# Patient Record
Sex: Female | Born: 1960 | Race: White | Hispanic: No | Marital: Married | Smoking: Current every day smoker
Health system: Southern US, Community
[De-identification: ages and names within clinical notes are randomized; demographics above are authoritative.]

## PROBLEM LIST (undated history)

## (undated) DIAGNOSIS — C801 Malignant (primary) neoplasm, unspecified: Secondary | ICD-10-CM

## (undated) DIAGNOSIS — H409 Unspecified glaucoma: Secondary | ICD-10-CM

## (undated) DIAGNOSIS — J4489 Other specified chronic obstructive pulmonary disease: Secondary | ICD-10-CM

## (undated) DIAGNOSIS — Z9981 Dependence on supplemental oxygen: Secondary | ICD-10-CM

## (undated) DIAGNOSIS — E113593 Type 2 diabetes mellitus with proliferative diabetic retinopathy without macular edema, bilateral: Secondary | ICD-10-CM

## (undated) DIAGNOSIS — J449 Chronic obstructive pulmonary disease, unspecified: Secondary | ICD-10-CM

## (undated) DIAGNOSIS — E785 Hyperlipidemia, unspecified: Secondary | ICD-10-CM

## (undated) DIAGNOSIS — E119 Type 2 diabetes mellitus without complications: Secondary | ICD-10-CM

## (undated) DIAGNOSIS — I255 Ischemic cardiomyopathy: Secondary | ICD-10-CM

## (undated) DIAGNOSIS — I5020 Unspecified systolic (congestive) heart failure: Secondary | ICD-10-CM

## (undated) DIAGNOSIS — F329 Major depressive disorder, single episode, unspecified: Secondary | ICD-10-CM

## (undated) DIAGNOSIS — R112 Nausea with vomiting, unspecified: Secondary | ICD-10-CM

## (undated) DIAGNOSIS — Z972 Presence of dental prosthetic device (complete) (partial): Secondary | ICD-10-CM

## (undated) DIAGNOSIS — Z87442 Personal history of urinary calculi: Secondary | ICD-10-CM

## (undated) DIAGNOSIS — Z95828 Presence of other vascular implants and grafts: Secondary | ICD-10-CM

## (undated) DIAGNOSIS — I4891 Unspecified atrial fibrillation: Secondary | ICD-10-CM

## (undated) DIAGNOSIS — Z889 Allergy status to unspecified drugs, medicaments and biological substances status: Secondary | ICD-10-CM

## (undated) DIAGNOSIS — F32A Depression, unspecified: Secondary | ICD-10-CM

## (undated) DIAGNOSIS — I10 Essential (primary) hypertension: Secondary | ICD-10-CM

## (undated) DIAGNOSIS — Z9889 Other specified postprocedural states: Secondary | ICD-10-CM

## (undated) DIAGNOSIS — I447 Left bundle-branch block, unspecified: Secondary | ICD-10-CM

## (undated) HISTORY — DX: Hyperlipidemia, unspecified: E78.5

## (undated) HISTORY — PX: TUBAL LIGATION: SHX77

## (undated) HISTORY — DX: Type 2 diabetes mellitus without complications: E11.9

## (undated) HISTORY — PX: EYE SURGERY: SHX253

---
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
## 2006-03-08 ENCOUNTER — Emergency Department: Payer: Self-pay | Admitting: Unknown Physician Specialty

## 2007-01-02 ENCOUNTER — Emergency Department: Payer: Self-pay | Admitting: Emergency Medicine

## 2008-05-17 ENCOUNTER — Emergency Department (HOSPITAL_COMMUNITY): Admission: EM | Admit: 2008-05-17 | Discharge: 2008-05-18 | Payer: Self-pay | Admitting: Emergency Medicine

## 2011-04-27 LAB — URINE MICROSCOPIC-ADD ON

## 2011-04-27 LAB — URINALYSIS, ROUTINE W REFLEX MICROSCOPIC
Glucose, UA: >1000 — AB
Protein, ur: 100 — AB
Specific Gravity, Urine: 1.025
pH: 6

## 2014-05-27 ENCOUNTER — Ambulatory Visit: Payer: Self-pay | Admitting: Ophthalmology

## 2014-07-12 DIAGNOSIS — J309 Allergic rhinitis, unspecified: Secondary | ICD-10-CM | POA: Insufficient documentation

## 2014-07-12 DIAGNOSIS — F329 Major depressive disorder, single episode, unspecified: Secondary | ICD-10-CM | POA: Insufficient documentation

## 2014-07-12 DIAGNOSIS — I10 Essential (primary) hypertension: Secondary | ICD-10-CM | POA: Insufficient documentation

## 2014-07-12 DIAGNOSIS — F419 Anxiety disorder, unspecified: Secondary | ICD-10-CM | POA: Insufficient documentation

## 2014-08-14 DIAGNOSIS — E559 Vitamin D deficiency, unspecified: Secondary | ICD-10-CM | POA: Insufficient documentation

## 2014-08-14 DIAGNOSIS — E119 Type 2 diabetes mellitus without complications: Secondary | ICD-10-CM | POA: Insufficient documentation

## 2014-10-07 ENCOUNTER — Ambulatory Visit
Admit: 2014-10-07 | Disposition: A | Discharge status: Home / Self Care | Payer: Self-pay | Attending: Family Medicine | Admitting: Family Medicine

## 2014-10-25 ENCOUNTER — Ambulatory Visit
Admit: 2014-10-25 | Disposition: A | Discharge status: Home / Self Care | Payer: Self-pay | Attending: Family Medicine | Admitting: Family Medicine

## 2015-02-25 ENCOUNTER — Other Ambulatory Visit: Payer: Self-pay | Admitting: Family Medicine

## 2015-02-25 DIAGNOSIS — Z1231 Encounter for screening mammogram for malignant neoplasm of breast: Secondary | ICD-10-CM

## 2015-03-14 ENCOUNTER — Ambulatory Visit
Admission: RE | Admit: 2015-03-14 | Discharge: 2015-03-14 | Disposition: A | Discharge status: Home / Self Care | Payer: BLUE CROSS/BLUE SHIELD | Source: Ambulatory Visit | Attending: Family Medicine | Admitting: Family Medicine

## 2015-03-14 DIAGNOSIS — Z1231 Encounter for screening mammogram for malignant neoplasm of breast: Secondary | ICD-10-CM | POA: Diagnosis not present

## 2016-04-14 ENCOUNTER — Encounter (INDEPENDENT_AMBULATORY_CARE_PROVIDER_SITE_OTHER): Payer: Self-pay

## 2016-05-17 ENCOUNTER — Encounter (INDEPENDENT_AMBULATORY_CARE_PROVIDER_SITE_OTHER): Payer: BLUE CROSS/BLUE SHIELD

## 2016-05-17 ENCOUNTER — Ambulatory Visit (INDEPENDENT_AMBULATORY_CARE_PROVIDER_SITE_OTHER): Payer: Self-pay | Admitting: Vascular Surgery

## 2016-05-31 ENCOUNTER — Other Ambulatory Visit: Payer: Self-pay | Admitting: Family Medicine

## 2016-05-31 DIAGNOSIS — Z1231 Encounter for screening mammogram for malignant neoplasm of breast: Secondary | ICD-10-CM

## 2016-06-09 ENCOUNTER — Ambulatory Visit
Admission: RE | Admit: 2016-06-09 | Discharge: 2016-06-09 | Disposition: A | Discharge status: Home / Self Care | Payer: PRIVATE HEALTH INSURANCE | Source: Ambulatory Visit | Attending: Family Medicine | Admitting: Family Medicine

## 2016-06-09 DIAGNOSIS — Z1231 Encounter for screening mammogram for malignant neoplasm of breast: Secondary | ICD-10-CM | POA: Diagnosis present

## 2017-02-09 DIAGNOSIS — I517 Cardiomegaly: Secondary | ICD-10-CM | POA: Insufficient documentation

## 2017-05-13 DIAGNOSIS — J449 Chronic obstructive pulmonary disease, unspecified: Secondary | ICD-10-CM | POA: Insufficient documentation

## 2017-05-13 DIAGNOSIS — J4489 Other specified chronic obstructive pulmonary disease: Secondary | ICD-10-CM | POA: Insufficient documentation

## 2017-07-28 DIAGNOSIS — I255 Ischemic cardiomyopathy: Secondary | ICD-10-CM | POA: Insufficient documentation

## 2017-07-28 DIAGNOSIS — I447 Left bundle-branch block, unspecified: Secondary | ICD-10-CM | POA: Insufficient documentation

## 2017-09-28 ENCOUNTER — Other Ambulatory Visit: Payer: Self-pay | Admitting: Family Medicine

## 2017-09-28 DIAGNOSIS — Z1231 Encounter for screening mammogram for malignant neoplasm of breast: Secondary | ICD-10-CM

## 2017-09-30 ENCOUNTER — Other Ambulatory Visit: Payer: Self-pay | Admitting: Physician Assistant

## 2017-09-30 DIAGNOSIS — S62015P Nondisplaced fracture of distal pole of navicular [scaphoid] bone of left wrist, subsequent encounter for fracture with malunion: Secondary | ICD-10-CM

## 2017-10-05 ENCOUNTER — Ambulatory Visit
Admission: RE | Admit: 2017-10-05 | Discharge: 2017-10-05 | Disposition: A | Discharge status: Home / Self Care | Payer: BLUE CROSS/BLUE SHIELD | Source: Ambulatory Visit | Attending: Family Medicine | Admitting: Family Medicine

## 2017-10-05 DIAGNOSIS — Z1231 Encounter for screening mammogram for malignant neoplasm of breast: Secondary | ICD-10-CM | POA: Diagnosis present

## 2017-10-11 ENCOUNTER — Ambulatory Visit
Admission: RE | Admit: 2017-10-11 | Discharge: 2017-10-11 | Disposition: A | Discharge status: Home / Self Care | Payer: BLUE CROSS/BLUE SHIELD | Source: Ambulatory Visit | Attending: Physician Assistant | Admitting: Physician Assistant

## 2017-10-11 DIAGNOSIS — S62015P Nondisplaced fracture of distal pole of navicular [scaphoid] bone of left wrist, subsequent encounter for fracture with malunion: Secondary | ICD-10-CM

## 2017-10-11 DIAGNOSIS — S62015A Nondisplaced fracture of distal pole of navicular [scaphoid] bone of left wrist, initial encounter for closed fracture: Secondary | ICD-10-CM | POA: Insufficient documentation

## 2017-10-11 DIAGNOSIS — X58XXXA Exposure to other specified factors, initial encounter: Secondary | ICD-10-CM | POA: Insufficient documentation

## 2017-11-30 DIAGNOSIS — I272 Pulmonary hypertension, unspecified: Secondary | ICD-10-CM | POA: Insufficient documentation

## 2017-12-09 ENCOUNTER — Encounter: Payer: Self-pay | Admitting: Emergency Medicine

## 2017-12-12 ENCOUNTER — Ambulatory Visit
Admission: RE | Admit: 2017-12-12 | Payer: PRIVATE HEALTH INSURANCE | Source: Ambulatory Visit | Admitting: Gastroenterology

## 2017-12-12 ENCOUNTER — Encounter: Admission: RE | Payer: Self-pay | Source: Ambulatory Visit

## 2017-12-12 HISTORY — DX: Allergy status to unspecified drugs, medicaments and biological substances: Z88.9

## 2017-12-12 HISTORY — DX: Left bundle-branch block, unspecified: I44.7

## 2017-12-12 HISTORY — DX: Other specified chronic obstructive pulmonary disease: J44.89

## 2017-12-12 HISTORY — DX: Essential (primary) hypertension: I10

## 2017-12-12 HISTORY — DX: Depression, unspecified: F32.A

## 2017-12-12 HISTORY — DX: Major depressive disorder, single episode, unspecified: F32.9

## 2017-12-12 HISTORY — DX: Chronic obstructive pulmonary disease, unspecified: J44.9

## 2017-12-12 HISTORY — DX: Type 2 diabetes mellitus with proliferative diabetic retinopathy without macular edema, bilateral: E11.3593

## 2017-12-12 HISTORY — DX: Unspecified glaucoma: H40.9

## 2017-12-12 HISTORY — DX: Ischemic cardiomyopathy: I25.5

## 2017-12-12 SURGERY — COLONOSCOPY WITH PROPOFOL
Anesthesia: General

## 2018-01-12 DIAGNOSIS — R911 Solitary pulmonary nodule: Secondary | ICD-10-CM | POA: Insufficient documentation

## 2018-05-08 DIAGNOSIS — R700 Elevated erythrocyte sedimentation rate: Secondary | ICD-10-CM | POA: Insufficient documentation

## 2018-05-22 DIAGNOSIS — R768 Other specified abnormal immunological findings in serum: Secondary | ICD-10-CM | POA: Insufficient documentation

## 2018-10-16 DIAGNOSIS — R160 Hepatomegaly, not elsewhere classified: Secondary | ICD-10-CM | POA: Insufficient documentation

## 2018-10-16 DIAGNOSIS — I2510 Atherosclerotic heart disease of native coronary artery without angina pectoris: Secondary | ICD-10-CM | POA: Insufficient documentation

## 2018-10-25 DIAGNOSIS — C801 Malignant (primary) neoplasm, unspecified: Secondary | ICD-10-CM

## 2018-10-25 HISTORY — DX: Malignant (primary) neoplasm, unspecified: C80.1

## 2018-10-27 DIAGNOSIS — I739 Peripheral vascular disease, unspecified: Secondary | ICD-10-CM | POA: Insufficient documentation

## 2018-11-02 DIAGNOSIS — Z951 Presence of aortocoronary bypass graft: Secondary | ICD-10-CM | POA: Insufficient documentation

## 2018-11-02 DIAGNOSIS — G8918 Other acute postprocedural pain: Secondary | ICD-10-CM | POA: Insufficient documentation

## 2018-11-05 DIAGNOSIS — I4891 Unspecified atrial fibrillation: Secondary | ICD-10-CM | POA: Insufficient documentation

## 2018-11-05 DIAGNOSIS — I9789 Other postprocedural complications and disorders of the circulatory system, not elsewhere classified: Secondary | ICD-10-CM | POA: Insufficient documentation

## 2018-11-06 DIAGNOSIS — K567 Ileus, unspecified: Secondary | ICD-10-CM | POA: Insufficient documentation

## 2019-01-03 ENCOUNTER — Encounter: Payer: Self-pay | Admitting: *Deleted

## 2019-01-31 DIAGNOSIS — C221 Intrahepatic bile duct carcinoma: Secondary | ICD-10-CM | POA: Insufficient documentation

## 2019-02-21 DIAGNOSIS — E042 Nontoxic multinodular goiter: Secondary | ICD-10-CM | POA: Insufficient documentation

## 2019-02-21 DIAGNOSIS — Z79899 Other long term (current) drug therapy: Secondary | ICD-10-CM | POA: Insufficient documentation

## 2019-02-21 DIAGNOSIS — E0590 Thyrotoxicosis, unspecified without thyrotoxic crisis or storm: Secondary | ICD-10-CM | POA: Insufficient documentation

## 2019-04-12 ENCOUNTER — Emergency Department: Payer: BC Managed Care – PPO

## 2019-04-12 ENCOUNTER — Encounter: Payer: Self-pay | Admitting: Emergency Medicine

## 2019-04-12 ENCOUNTER — Emergency Department
Admission: EM | Admit: 2019-04-12 | Discharge: 2019-04-12 | Disposition: A | Discharge status: Home / Self Care | Payer: BC Managed Care – PPO | Attending: Emergency Medicine | Admitting: Emergency Medicine

## 2019-04-12 ENCOUNTER — Other Ambulatory Visit: Payer: Self-pay

## 2019-04-12 DIAGNOSIS — Z7982 Long term (current) use of aspirin: Secondary | ICD-10-CM | POA: Insufficient documentation

## 2019-04-12 DIAGNOSIS — Z7984 Long term (current) use of oral hypoglycemic drugs: Secondary | ICD-10-CM | POA: Insufficient documentation

## 2019-04-12 DIAGNOSIS — Z79899 Other long term (current) drug therapy: Secondary | ICD-10-CM | POA: Insufficient documentation

## 2019-04-12 DIAGNOSIS — F17210 Nicotine dependence, cigarettes, uncomplicated: Secondary | ICD-10-CM | POA: Diagnosis not present

## 2019-04-12 DIAGNOSIS — I10 Essential (primary) hypertension: Secondary | ICD-10-CM | POA: Diagnosis not present

## 2019-04-12 DIAGNOSIS — R059 Cough, unspecified: Secondary | ICD-10-CM

## 2019-04-12 DIAGNOSIS — R05 Cough: Secondary | ICD-10-CM | POA: Diagnosis present

## 2019-04-12 DIAGNOSIS — F17200 Nicotine dependence, unspecified, uncomplicated: Secondary | ICD-10-CM

## 2019-04-12 DIAGNOSIS — E119 Type 2 diabetes mellitus without complications: Secondary | ICD-10-CM | POA: Diagnosis not present

## 2019-04-12 HISTORY — DX: Unspecified atrial fibrillation: I48.91

## 2019-04-12 NOTE — ED Triage Notes (Addendum)
Patient ambulatory to triage with steady gait, without difficulty or distress noted, mask in place; pt reports having nonprod cough x 38mos; +smoker; denies any accomp symptoms

## 2019-04-12 NOTE — ED Provider Notes (Signed)
[facility] Emergency Department Provider Note  ____________________________________________   First MD Initiated Contact with Patient 04/12/19 [phone]     (approximate)  I have reviewed the triage vital signs and the nursing notes.   HISTORY  Chief Complaint Cough   HPI Courtney Rich is a 58 y.o. female presents to the ED with complaint of nonproductive cough for the last 2 months.  Patient states that she is continued her same medication for her COPD.  Patient had a video visit with her doctor and was placed on Tessalon Per[place] which she states did not help.  Patient continues to smoke 1/2 pack cigarettes per day.  She denies any nasal congestion, change in taste or smell, GI symptoms and denies any exposure to COVID.  She rates her pain as a 0/10.     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   Past Surgical History:  Procedure Laterality Date   CARDIAC SURGERY     EYE SURGERY     TUBAL LIGATION      Prior to Admission medications   Medication Sig Start Date End Date Taking? Authorizing Provider  methimazole (TAPAZOLE) 5 MG tablet Take 5 mg by mouth 3 (three) times daily.   Yes [provider]  albuterol (PROVENTIL HFA;VENTOLIN HFA) 108 (90 Base) MCG/ACT inhaler Inhale 2 puffs into the lungs every 4 (four) hours as needed for wheezing or shortness of breath.    [provider]  aspirin EC 81 MG  tablet Take 81 mg by mouth daily.    [provider]  brimonidine (ALPHAGAN) 0.2 % ophthalmic solution Place into the right eye 2 (two) times daily.    [provider]  carvedilol (COREG) 3.125 MG tablet Take 3.125 mg by mouth 2 (two) times daily with a meal.    [provider]  cholecalciferol (VITAMIN D) 1000 units tablet Take 1,000 Units by mouth daily.    [provider]  isosorbide mononitrate (IMDUR) 30 MG 24 hr tablet Take 30 mg by mouth daily.    [provider]  ketoconazole (NIZORAL) 2 % cream Apply 1 application topically 2 (two) times daily.    [provider]  losartan (COZAAR) 25 MG tablet Take 25 mg by mouth daily.    [provider]  metFORMIN (GLUCOPHAGE) 500 MG tablet Take 1,000 mg by mouth 2 (two) times daily with a meal.     [provider]  pravastatin (PRAVACHOL) 40 MG tablet Take 40 mg by mouth daily.    [provider]  sertraline (ZOLOFT) 50 MG tablet Take 50 mg by mouth daily.    [provider]  timolol (TIMOPTIC) 0.25 % ophthalmic solution Place 1 drop into the right eye 2 (two) times daily.    [provider]  tiotropium (SPIRIVA) 18 MCG inhalation capsule Place 18 mcg into inhaler  and inhale daily.    [provider]    Allergies Lisinopril and Sulfa antibiotics  Family History  Problem Relation Age of Onset   Breast cancer Neg Hx     Social History Social History   Tobacco Use   Smoking status: Current Every Day Smoker   Smokeless tobacco: Never Used  Substance Use Topics   Alcohol use: No   Drug use: Not on file    Review of Systems Constitutional: No fever/chills Eyes: No visual changes. ENT: No sore throat. Cardiovascular: Denies chest pain. Respiratory: Denies shortness of breath.  Positive nonproductive cough. Gastrointestinal: No abdominal pain.  No nausea, no vomiting.  No diarrhea. Genitourinary: Negative for  dysuria. Musculoskeletal: Negative for back pain. Skin: Negative for rash. Neurological: Negative for headaches, focal weakness or numbness. ___________________________________________   PHYSICAL EXAM:  VITAL SIGNS: ED Triage Vitals [04/12/19 0640]  Enc Vitals Group     BP      Pulse      Resp      Temp      Temp src      SpO2      Weight 112 lb (50.8 kg)     Height 5\' 1"  (1.549 m)     Head Circumference      Peak Flow      Pain Score 0     Pain Loc      Pain Edu?      Excl. in [place]?    Constitutional: Alert and oriented. Well appearing and in no acute distress. Eyes: Conjunctivae are normal.  Head: Atraumatic. Nose: No congestion/rhinnorhea. Mouth/Throat: Mucous membranes are moist.  Oropharynx non-erythematous. Neck: No stridor.   Hematological/Lymphatic/Immunilogical: No cervical lymphadenopathy. Cardiovascular: Normal rate, regular rhythm. Grossly normal heart sounds.  Good peripheral circulation. Respiratory: Normal respiratory effort.  No retractions. Lungs CTAB.  No rales, rhonchi or wheezing noted. Musculoskeletal: Moves upper and lower extremities that any difficulty.  No edema is noted to the lower extremities.  Patient is amatory without any assistance. Neurologic:  Normal speech and language. No gross focal neurologic deficits are appreciated.  Skin:  Skin is warm, dry and intact. No rash noted. Psychiatric: Mood and affect are normal. Speech and behavior are normal.  ____________________________________________   LABS (all labs ordered are listed, but only abnormal results are displayed)  Labs Reviewed - No data to display RADIOLOGY  Official radiology report(s): Dg Chest 2 View  Result Date: 04/12/2019 CLINICAL DATA:  Cough EXAM: CHEST - 2 VIEW COMPARISON:  None. FINDINGS: There is mild prominence to the cardiac silhouette. Overlying median sternotomy wires and surgical clips are seen. A right-sided MediPort catheter seen with the tip at the superior  cavoatrial junction. Mildly increased interstitial markings seen throughout both lungs. IMPRESSION: Mild interstitial edema. Electronically Signed   By: Prudencio Pair M.D.   On: 04/12/2019 08:03    ____________________________________________   PROCEDURES  Procedure(s) performed (including Critical Care):  Procedures  ____________________________________________   INITIAL IMPRESSION / ASSESSMENT AND PLAN / ED COURSE  As part of my medical decision making, I reviewed the following data within the electronic MEDICAL RECORD NUMBER Notes from prior ED visits and [place] Controlled Substance Database  58 year old female presents to the ED with complaint of cough for the last 2 months.  She states that she has COPD and continues to use her inhalers as prescribed by her doctor on a regular basis.  On 03/19/2019 she had a telephone visit with her doctor at which time she  was prescribed Tessalon.  Patient states this does not help with her cough.  She continues to smoke 1/2 pack cigarettes per day.  Lungs are clear without wheezing.  She is able to speak in complete sentences without any shortness of breath.  It appears in her chart that steroids were discussed but because patient is undergoing chemotherapy and is also diabetic that this would lower her immune system and put her at even higher risk for COVID-19.  Patient still agrees with this.  She is reassured that she does not have pneumonia.  She was encouraged to discontinue smoking.  Patient will follow-up with her PCP if any continued problems.  ____________________________________________   FINAL CLINICAL IMPRESSION(S) / ED DIAGNOSES  Final diagnoses:  Cough  Current every day smoker     ED Discharge Orders    None       Note:  This document was prepared using Dragon voice recognition software and may include unintentional dictation errors.    Johnn Hai, PA-C 04/12/19 1122    Nena Polio, MD 04/12/19 [phone]

## 2019-04-12 NOTE — ED Notes (Signed)
See triage note  Presents with cough for about 2 months  States she has COPD but the is not able to stop the cough  Has inhalers she uses on regular basis   Also is having some right arm  Denies any pain to arm but feels like she is not able to move it well  No deformity noted   Good pulses

## 2019-04-12 NOTE — Discharge Instructions (Signed)
Follow-up with your primary care provider if any continued problems.  Continue with your regular medications including your inhalers.  You may also take Robitussin-DM if needed for cough.  Increase fluids.  Decrease smoking.

## 2019-04-24 ENCOUNTER — Encounter (INDEPENDENT_AMBULATORY_CARE_PROVIDER_SITE_OTHER): Payer: Self-pay | Admitting: Vascular Surgery

## 2019-04-24 ENCOUNTER — Telehealth (INDEPENDENT_AMBULATORY_CARE_PROVIDER_SITE_OTHER): Payer: Self-pay

## 2019-04-24 ENCOUNTER — Ambulatory Visit (INDEPENDENT_AMBULATORY_CARE_PROVIDER_SITE_OTHER): Payer: BC Managed Care – PPO | Admitting: Vascular Surgery

## 2019-04-24 ENCOUNTER — Other Ambulatory Visit: Payer: Self-pay

## 2019-04-24 VITALS — BP 95/61 | HR 95 | Resp 16 | Ht 61.0 in | Wt 111.6 lb

## 2019-04-24 DIAGNOSIS — E119 Type 2 diabetes mellitus without complications: Secondary | ICD-10-CM | POA: Diagnosis not present

## 2019-04-24 DIAGNOSIS — I70213 Atherosclerosis of native arteries of extremities with intermittent claudication, bilateral legs: Secondary | ICD-10-CM

## 2019-04-24 DIAGNOSIS — C221 Intrahepatic bile duct carcinoma: Secondary | ICD-10-CM

## 2019-04-24 DIAGNOSIS — I70219 Atherosclerosis of native arteries of extremities with intermittent claudication, unspecified extremity: Secondary | ICD-10-CM | POA: Insufficient documentation

## 2019-04-24 DIAGNOSIS — F17200 Nicotine dependence, unspecified, uncomplicated: Secondary | ICD-10-CM | POA: Diagnosis not present

## 2019-04-24 DIAGNOSIS — I10 Essential (primary) hypertension: Secondary | ICD-10-CM

## 2019-04-24 DIAGNOSIS — H409 Unspecified glaucoma: Secondary | ICD-10-CM | POA: Insufficient documentation

## 2019-04-24 NOTE — Assessment & Plan Note (Signed)
The patient been previously diagnosed with atherosclerotic peripheral arterial disease several years ago although at that time, she reports that her disease was not that severe.  Her symptoms are markedly worse now. We discussed the pathophysiology and natural history of peripheral arterial disease in some detail today.  I do not think that is the only cause of her lower extremity symptoms, but I do think it is the major cause.  We discussed noninvasive studies and she has had some of these previously although it has been sometime.  She is interested in proceeding with angiogram with potential revascularization in the near future.  This is certainly reasonable.

## 2019-04-24 NOTE — Assessment & Plan Note (Signed)
Currently getting chemotherapy.  Chemotherapy could be worsening her leg pain as well.  Difficult problem

## 2019-04-24 NOTE — Assessment & Plan Note (Signed)
blood glucose control important in reducing the progression of atherosclerotic disease. Also, involved in wound healing. On appropriate medications.  

## 2019-04-24 NOTE — Telephone Encounter (Signed)
Spoke with the patient and she is scheduled to have her angio procedures with Dr. Lucky Cowboy. Right leg on 05/14/2019 with a 6:45 am arrival time to the [facility]. Patient will do her Covid testing on 05/10/2019 between 12:30-2:30 pm at the [facility]. Left leg on 05/21/2019 with 6:45 am arrival time to the [facility]. Patient will do Covid testing on 05/17/2019 between 12:30-2:30 pm to the [facility]. Patient understood, pre-procedure instructions will be mailed to patient.

## 2019-04-24 NOTE — Assessment & Plan Note (Signed)
blood pressure control important in reducing the progression of atherosclerotic disease. On appropriate oral medications.  

## 2019-04-24 NOTE — Assessment & Plan Note (Signed)
Discussed that this is a primary risk factor for progression of atherosclerotic peripheral arterial disease.  Smoking cessation would be of benefit, but given her multiple ongoing issues that is but 1 of the major risk factors are current.

## 2019-04-24 NOTE — Patient Instructions (Signed)

## 2019-04-24 NOTE — Progress Notes (Signed)
Patient ID: Courtney Rich, female   DOB: Jun 05, 1961, 58 y.o.   MRN: [id number]  Chief Complaint  Patient presents with  . New Patient (Initial Visit)    ref [place] for claudication    HPI Courtney Rich is a 58 y.o. female.  I am asked to see the patient by Dr. Pricilla Riffle for evaluation of PAD with claudication.  She says she was seen in our office 5 or so years ago and told that her PAD was "not too bad" at that time.  Over the past few years, her claudication symptoms have markedly worsened and she is now able only to walk 50 to 75 feet without having to stop and rest.  She has to get a motorized cart when she goes to a store.  Both legs are affected.  She has multiple ongoing issues including significant heart disease, ureteral stones requiring treatment next month, and chemotherapy for cholangiocarcinoma.  All of those things are major issues, but her leg pain with activity is debilitating to her at this point.  She does not have any open wounds or infection.  Both lower extremities are affected about the same.  Nothing really makes it that much better.  Any activity brings on the pain.     Past Medical History:  Diagnosis Date  . A-fib ([place])   . Allergic genetic state   . Cancer ([place])   . 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 Depression   . Depression   . Diabetes mellitus without complication ([place])   . Glaucoma (increased eye pressure)   . Hyperlipidemia   . Hypertension   . Ischemic cardiomyopathy   . Left bundle branch block (LBBB)   . Left bundle branch block (LBBB)   . Proliferative diabetic retinopathy of both eyes ([place])   . Proliferative diabetic retinopathy of both eyes [facility])     Past Surgical History:  Procedure Laterality Date  . CARDIAC SURGERY    . EYE SURGERY    . TUBAL LIGATION       Family History  Problem Relation Age of Onset  . Breast cancer Neg Hx   No bleeding disorders, clotting disorders, autoimmune diseases or aneurysms  Social History  Social History   Tobacco Use  . Smoking status: Current Every Day Smoker  . Smokeless tobacco: Never Used  Substance Use Topics  . Alcohol use: No  . Drug use: Never     Allergies  Allergen Reactions  . Dapagliflozin Nausea Only  . Empagliflozin Nausea Only  . Lisinopril     Muscle Pain  . Other   . Sulfa Antibiotics Swelling    Hives    Current Outpatient Medications  Medication Sig Dispense Refill  . albuterol (PROVENTIL HFA;VENTOLIN HFA) 108 (90 Base) MCG/ACT inhaler Inhale 2 puffs into the lungs every 4 (four) hours as needed for wheezing or shortness of breath.    [name] aspirin EC 81 MG tablet Take 81 mg by mouth daily.    . brimonidine (ALPHAGAN) 0.2 % ophthalmic solution Place into the right eye 2 (two) times daily.    . carvedilol (COREG) 3.125 MG tablet Take 3.125 mg by mouth 2 (two) times daily with a meal.    . cholecalciferol (VITAMIN D) 1000 units tablet Take 1,000 Units by mouth daily.    . isosorbide mononitrate (IMDUR) 30 MG 24 hr tablet Take 30 mg by mouth daily.    [name] ketoconazole (NIZORAL) 2 % cream Apply 1 application topically  2 (two) times daily.    [name] losartan (COZAAR) 25 MG tablet Take 25 mg by mouth daily.    . metFORMIN (GLUCOPHAGE) 500 MG tablet Take 1,000 mg by mouth 2 (two) times daily with a meal.     . methimazole (TAPAZOLE) 5 MG tablet Take 5 mg by mouth 3 (three) times daily.    . pravastatin (PRAVACHOL) 40 MG tablet Take 40 mg by mouth daily.    . sertraline (ZOLOFT) 50 MG tablet Take 50 mg by mouth daily.    . timolol (TIMOPTIC) 0.25 % ophthalmic solution Place 1 drop into the right eye 2 (two) times daily.    [name] tiotropium (SPIRIVA) 18 MCG inhalation capsule Place 18 mcg into inhaler and inhale daily.     No current facility-administered medications for this visit.       REVIEW OF SYSTEMS (Negative unless checked)  Constitutional: [] Weight loss  [] Fever  [] Chills Cardiac: [] Chest pain   [] Chest pressure   [] Palpitations   [] Shortness of  breath when laying flat   [] Shortness of breath at rest   [] Shortness of breath with exertion. Vascular:  [x] Pain in legs with walking   [] Pain in legs at rest   [] Pain in legs when laying flat   [x] Claudication   [] Pain in feet when walking  [] Pain in feet at rest  [] Pain in feet when laying flat   [] History of DVT   [] Phlebitis   [] Swelling in legs   [] Varicose veins   [] Non-healing ulcers Pulmonary:   [] Uses home oxygen   [] Productive cough   [] Hemoptysis   [] Wheeze  [] COPD   [] Asthma Neurologic:  [] Dizziness  [] Blackouts   [] Seizures   [] History of stroke   [] History of TIA  [] Aphasia   [] Temporary blindness   [] Dysphagia   [] Weakness or numbness in arms   [x] Weakness or numbness in legs Musculoskeletal:  [x] Arthritis   [] Joint swelling   [] Joint pain   [] Low back pain Hematologic:  [] Easy bruising  [] Easy bleeding   [] Hypercoagulable state   [] Anemic  [] Hepatitis Gastrointestinal:  [] Blood in stool   [] Vomiting blood  [] Gastroesophageal reflux/heartburn   [] Abdominal pain Genitourinary:  [] Chronic kidney disease   [x] Difficult urination  [] Frequent urination  [] Burning with urination   [x] Hematuria Skin:  [] Rashes   [] Ulcers   [] Wounds Psychological:  [] History of anxiety   []  History of major depression.    Physical Exam BP 95/61 (BP Location: Right Arm)   Pulse 95   Resp 16   Ht 5\' 1"  (1.549 m)   Wt 111 lb 9.6 oz (50.6 kg)   BMI 21.09 kg/m  Gen: Thin, NAD.  Appears a little older than stated age Head: [address]/AT, No temporalis wasting.  Ear/Nose/Throat: Hearing grossly intact, nares w/o erythema or drainage, oropharynx w/o Erythema/Exudate Eyes: Conjunctiva clear, sclera non-icteric  Neck: trachea midline.  No JVD.  Pulmonary:  Good air movement, respirations not labored, no use of accessory muscles  Cardiac: RRR, no JVD Vascular:  Vessel Right Left  Radial Palpable Palpable                          DP  1+  1+  PT   1+  1+   Gastrointestinal:. No masses, surgical incisions,  or scars. Musculoskeletal: M/S 5/5 throughout.  Extremities without ischemic changes.  No deformity or atrophy.  No edema. Neurologic: Sensation grossly intact in extremities.  Symmetrical.  Speech is fluent. Motor exam as listed above. Psychiatric:  Judgment intact, Mood & affect appropriate for pt's clinical situation. Dermatologic: No rashes or ulcers noted.  No cellulitis or open wounds.    Radiology Dg Chest 2 View  Result Date: 04/12/2019 CLINICAL DATA:  Cough EXAM: CHEST - 2 VIEW COMPARISON:  None. FINDINGS: There is mild prominence to the cardiac silhouette. Overlying median sternotomy wires and surgical clips are seen. A right-sided MediPort catheter seen with the tip at the superior cavoatrial junction. Mildly increased interstitial markings seen throughout both lungs. IMPRESSION: Mild interstitial edema. Electronically Signed   By: Prudencio Pair M.D.   On: 04/12/2019 08:03    Labs No results found for this or any previous visit (from the past 2160 hour(s)).  Assessment/Plan:  Essential hypertension blood pressure control important in reducing the progression of atherosclerotic disease. On appropriate oral medications.   Diabetes mellitus type 2, uncomplicated (HCC) blood glucose control important in reducing the progression of atherosclerotic disease. Also, involved in wound healing. On appropriate medications.   Tobacco dependence Discussed that this is a primary risk factor for progression of atherosclerotic peripheral arterial disease.  Smoking cessation would be of benefit, but given her multiple ongoing issues that is but 1 of the major risk factors are current.  Intrahepatic cholangiocarcinoma ([place]) Currently getting chemotherapy.  Chemotherapy could be worsening her leg pain as well.  Difficult problem  Atherosclerosis of native arteries of extremity with intermittent claudication (HCC) The patient been previously diagnosed with atherosclerotic peripheral arterial  disease several years ago although at that time, she reports that her disease was not that severe.  Her symptoms are markedly worse now. We discussed the pathophysiology and natural history of peripheral arterial disease in some detail today.  I do not think that is the only cause of her lower extremity symptoms, but I do think it is the major cause.  We discussed noninvasive studies and she has had some of these previously although it has been sometime.  She is interested in proceeding with angiogram with potential revascularization in the near future.  This is certainly reasonable.      Leotis Pain 04/24/2019, 1:44 PM   This note was created with Dragon medical transcription system.  Any errors from dictation are unintentional.

## 2019-05-10 ENCOUNTER — Other Ambulatory Visit
Admission: RE | Admit: 2019-05-10 | Discharge: 2019-05-10 | Disposition: A | Discharge status: Home / Self Care | Payer: BC Managed Care – PPO | Source: Ambulatory Visit | Attending: Vascular Surgery | Admitting: Vascular Surgery

## 2019-05-10 ENCOUNTER — Encounter (INDEPENDENT_AMBULATORY_CARE_PROVIDER_SITE_OTHER): Payer: Self-pay

## 2019-05-10 ENCOUNTER — Other Ambulatory Visit: Payer: Self-pay

## 2019-05-10 DIAGNOSIS — Z01812 Encounter for preprocedural laboratory examination: Secondary | ICD-10-CM | POA: Insufficient documentation

## 2019-05-10 DIAGNOSIS — Z20828 Contact with and (suspected) exposure to other viral communicable diseases: Secondary | ICD-10-CM | POA: Diagnosis not present

## 2019-05-11 LAB — SARS CORONAVIRUS 2 (TAT 6-24 HRS): SARS Coronavirus 2: NEGATIVE

## 2019-05-13 ENCOUNTER — Other Ambulatory Visit (INDEPENDENT_AMBULATORY_CARE_PROVIDER_SITE_OTHER): Payer: Self-pay | Admitting: Nurse Practitioner

## 2019-05-14 ENCOUNTER — Encounter: Payer: Self-pay | Admitting: *Deleted

## 2019-05-14 ENCOUNTER — Telehealth (INDEPENDENT_AMBULATORY_CARE_PROVIDER_SITE_OTHER): Payer: Self-pay

## 2019-05-14 ENCOUNTER — Other Ambulatory Visit: Payer: Self-pay

## 2019-05-14 ENCOUNTER — Encounter
Admission: RE | Disposition: A | Discharge status: Home / Self Care | Payer: Self-pay | Source: Home / Self Care | Attending: Vascular Surgery

## 2019-05-14 ENCOUNTER — Encounter (INDEPENDENT_AMBULATORY_CARE_PROVIDER_SITE_OTHER): Payer: Self-pay

## 2019-05-14 ENCOUNTER — Ambulatory Visit
Admission: RE | Admit: 2019-05-14 | Discharge: 2019-05-14 | Disposition: A | Discharge status: Home / Self Care | Payer: BC Managed Care – PPO | Attending: Vascular Surgery | Admitting: Vascular Surgery

## 2019-05-14 DIAGNOSIS — Z7984 Long term (current) use of oral hypoglycemic drugs: Secondary | ICD-10-CM | POA: Diagnosis not present

## 2019-05-14 DIAGNOSIS — I4891 Unspecified atrial fibrillation: Secondary | ICD-10-CM | POA: Diagnosis not present

## 2019-05-14 DIAGNOSIS — F17200 Nicotine dependence, unspecified, uncomplicated: Secondary | ICD-10-CM | POA: Insufficient documentation

## 2019-05-14 DIAGNOSIS — E785 Hyperlipidemia, unspecified: Secondary | ICD-10-CM | POA: Insufficient documentation

## 2019-05-14 DIAGNOSIS — F329 Major depressive disorder, single episode, unspecified: Secondary | ICD-10-CM | POA: Insufficient documentation

## 2019-05-14 DIAGNOSIS — Z7982 Long term (current) use of aspirin: Secondary | ICD-10-CM | POA: Insufficient documentation

## 2019-05-14 DIAGNOSIS — I255 Ischemic cardiomyopathy: Secondary | ICD-10-CM | POA: Insufficient documentation

## 2019-05-14 DIAGNOSIS — I10 Essential (primary) hypertension: Secondary | ICD-10-CM | POA: Insufficient documentation

## 2019-05-14 DIAGNOSIS — J439 Emphysema, unspecified: Secondary | ICD-10-CM | POA: Diagnosis not present

## 2019-05-14 DIAGNOSIS — E113593 Type 2 diabetes mellitus with proliferative diabetic retinopathy without macular edema, bilateral: Secondary | ICD-10-CM | POA: Diagnosis not present

## 2019-05-14 DIAGNOSIS — Z79899 Other long term (current) drug therapy: Secondary | ICD-10-CM | POA: Diagnosis not present

## 2019-05-14 DIAGNOSIS — I70223 Atherosclerosis of native arteries of extremities with rest pain, bilateral legs: Secondary | ICD-10-CM

## 2019-05-14 DIAGNOSIS — I70219 Atherosclerosis of native arteries of extremities with intermittent claudication, unspecified extremity: Secondary | ICD-10-CM

## 2019-05-14 DIAGNOSIS — I447 Left bundle-branch block, unspecified: Secondary | ICD-10-CM | POA: Insufficient documentation

## 2019-05-14 DIAGNOSIS — C221 Intrahepatic bile duct carcinoma: Secondary | ICD-10-CM | POA: Insufficient documentation

## 2019-05-14 DIAGNOSIS — E1151 Type 2 diabetes mellitus with diabetic peripheral angiopathy without gangrene: Secondary | ICD-10-CM | POA: Diagnosis not present

## 2019-05-14 HISTORY — PX: LOWER EXTREMITY ANGIOGRAPHY: CATH118251

## 2019-05-14 LAB — CREATININE, SERUM
Creatinine, Ser: 0.53 mg/dL (ref 0.44–1.00)
GFR calc Af Amer: >60 mL/min (ref >60)
GFR calc non Af Amer: >60 mL/min (ref >60)

## 2019-05-14 LAB — BUN: BUN: 11 mg/dL (ref 6–20)

## 2019-05-14 LAB — GLUCOSE, CAPILLARY: Glucose-Capillary: 211 mg/dL — ABNORMAL HIGH (ref 70–99)

## 2019-05-14 SURGERY — LOWER EXTREMITY ANGIOGRAPHY
Anesthesia: Moderate Sedation | Laterality: Right

## 2019-05-14 MED ORDER — CLOPIDOGREL BISULFATE 75 MG PO TABS: 75.0000 mg | ORAL_TABLET | Freq: Every day | ORAL | 11 refills | Status: DC

## 2019-05-14 MED ORDER — ONDANSETRON HCL 4 MG/2ML IJ SOLN: 4.0000 mg | Freq: Four times a day (QID) | INTRAMUSCULAR | Status: DC | PRN

## 2019-05-14 MED ORDER — SODIUM CHLORIDE 0.9% FLUSH: 3.0000 mL | INTRAVENOUS | Status: DC | PRN

## 2019-05-14 MED ORDER — IODIXANOL 320 MG/ML IV SOLN
INTRAVENOUS | Status: DC | PRN
  Administered 2019-05-14: 90 mL via INTRA_ARTERIAL

## 2019-05-14 MED ORDER — SODIUM CHLORIDE 0.9 % IV SOLN: INTRAVENOUS | Status: DC

## 2019-05-14 MED ORDER — CLOPIDOGREL BISULFATE 75 MG PO TABS: 75.0000 mg | ORAL_TABLET | Freq: Every day | ORAL | Status: DC

## 2019-05-14 MED ORDER — HYDROMORPHONE HCL 1 MG/ML IJ SOLN: 1.0000 mg | Freq: Once | INTRAMUSCULAR | Status: DC | PRN

## 2019-05-14 MED ORDER — FENTANYL CITRATE (PF) 100 MCG/2ML IJ SOLN
INTRAMUSCULAR | Status: AC
  Filled 2019-05-14: qty 2

## 2019-05-14 MED ORDER — FAMOTIDINE 20 MG PO TABS: 40.0000 mg | ORAL_TABLET | Freq: Once | ORAL | Status: DC | PRN

## 2019-05-14 MED ORDER — METHYLPREDNISOLONE SODIUM SUCC 125 MG IJ SOLR: 125.0000 mg | Freq: Once | INTRAMUSCULAR | Status: DC | PRN

## 2019-05-14 MED ORDER — HYDRALAZINE HCL 20 MG/ML IJ SOLN: 5.0000 mg | INTRAMUSCULAR | Status: DC | PRN

## 2019-05-14 MED ORDER — CEFAZOLIN SODIUM-DEXTROSE 2-4 GM/100ML-% IV SOLN
2.0000 g | Freq: Once | INTRAVENOUS | Status: AC
  Administered 2019-05-14: 2 g via INTRAVENOUS

## 2019-05-14 MED ORDER — LABETALOL HCL 5 MG/ML IV SOLN: 10.0000 mg | INTRAVENOUS | Status: DC | PRN

## 2019-05-14 MED ORDER — SODIUM CHLORIDE 0.9 % IV SOLN
INTRAVENOUS | Status: DC
  Administered 2019-05-14: 07:00:00 via INTRAVENOUS

## 2019-05-14 MED ORDER — SODIUM CHLORIDE 0.9 % IV SOLN: 250.0000 mL | INTRAVENOUS | Status: DC | PRN

## 2019-05-14 MED ORDER — HEPARIN SODIUM (PORCINE) 1000 UNIT/ML IJ SOLN
INTRAMUSCULAR | Status: DC | PRN
  Administered 2019-05-14: 4000 [IU] via INTRAVENOUS

## 2019-05-14 MED ORDER — HEPARIN SOD (PORK) LOCK FLUSH 100 UNIT/ML IV SOLN
INTRAVENOUS | Status: AC
  Administered 2019-05-14: 500 [IU]
  Filled 2019-05-14: qty 5

## 2019-05-14 MED ORDER — MIDAZOLAM HCL 2 MG/ML PO SYRP: 8.0000 mg | ORAL_SOLUTION | Freq: Once | ORAL | Status: DC | PRN

## 2019-05-14 MED ORDER — SODIUM CHLORIDE 0.9% FLUSH: 3.0000 mL | Freq: Two times a day (BID) | INTRAVENOUS | Status: DC

## 2019-05-14 MED ORDER — MIDAZOLAM HCL 5 MG/5ML IJ SOLN
INTRAMUSCULAR | Status: AC
  Filled 2019-05-14: qty 5

## 2019-05-14 MED ORDER — FENTANYL CITRATE (PF) 100 MCG/2ML IJ SOLN
INTRAMUSCULAR | Status: DC | PRN
  Administered 2019-05-14 (×2): 25 ug via INTRAVENOUS
  Administered 2019-05-14: 50 ug via INTRAVENOUS
  Administered 2019-05-14: 25 ug via INTRAVENOUS

## 2019-05-14 MED ORDER — ACETAMINOPHEN 325 MG PO TABS: 650.0000 mg | ORAL_TABLET | ORAL | Status: DC | PRN

## 2019-05-14 MED ORDER — CEFAZOLIN SODIUM-DEXTROSE 2-4 GM/100ML-% IV SOLN
INTRAVENOUS | Status: AC
  Filled 2019-05-14: qty 100

## 2019-05-14 MED ORDER — HEPARIN SODIUM (PORCINE) 1000 UNIT/ML IJ SOLN
INTRAMUSCULAR | Status: AC
  Filled 2019-05-14: qty 1

## 2019-05-14 MED ORDER — MIDAZOLAM HCL 2 MG/2ML IJ SOLN
INTRAMUSCULAR | Status: DC | PRN
  Administered 2019-05-14: 2 mg via INTRAVENOUS
  Administered 2019-05-14 (×3): 1 mg via INTRAVENOUS

## 2019-05-14 MED ORDER — DIPHENHYDRAMINE HCL 50 MG/ML IJ SOLN: 50.0000 mg | Freq: Once | INTRAMUSCULAR | Status: DC | PRN

## 2019-05-14 SURGICAL SUPPLY — 19 items
BALLN LUTONIX 5X150X130 (BALLOONS) ×6
BALLN LUTONIX 5X220X130 (BALLOONS) ×3
BALLN ULTRVRSE 5X200X130 (BALLOONS) ×3
BALLOON LUTONIX 5X150X130 (BALLOONS) ×2 IMPLANT
BALLOON LUTONIX 5X220X130 (BALLOONS) ×1 IMPLANT
BALLOON ULTRVRSE 5X200X130 (BALLOONS) ×1 IMPLANT
CANNULA 5F STIFF (CANNULA) ×3 IMPLANT
CATH PIG 70CM (CATHETERS) ×3 IMPLANT
DEVICE PRESTO INFLATION (MISCELLANEOUS) ×3 IMPLANT
DEVICE STARCLOSE SE CLOSURE (Vascular Products) ×3 IMPLANT
GLIDEWIRE ADV .035X260CM (WIRE) ×3 IMPLANT
LIFESTENT SOLO 6X200X135 (Permanent Stent) ×3 IMPLANT
PACK ANGIOGRAPHY (CUSTOM PROCEDURE TRAY) ×3 IMPLANT
SHEATH ANL2 6FRX45 HC (SHEATH) ×3 IMPLANT
SHEATH BRITE TIP 5FRX11 (SHEATH) ×3 IMPLANT
SHEATH BRITE TIP 6FRX5.5 (SHEATH) ×3 IMPLANT
SYR MEDRAD MARK 7 150ML (SYRINGE) ×3 IMPLANT
TUBING CONTRAST HIGH PRESS 72 (TUBING) ×3 IMPLANT
WIRE J 3MM .035X145CM (WIRE) ×3 IMPLANT

## 2019-05-14 NOTE — Op Note (Signed)
[facility]  Percutaneous Study/Intervention Procedural Note   Date of Surgery: 05/14/2019  Surgeon(s):Jarrid Lienhard    Assistants:none  Pre-operative Diagnosis: PAD with claudication and rest pain bilateral lower extremities  Post-operative diagnosis:  Same  Procedure(s) Performed:             1.  Ultrasound guidance for vascular access left femoral artery             2.  Catheter placement into right common femoral artery from left femoral approach             3.  Aortogram and selective right lower extremity angiogram             4.  Percutaneous transluminal angioplasty of left iliac artery with 5 mm diameter by 15 cm length Lutonix drug-coated angioplasty balloon             5.   Percutaneous transluminal angioplasty of the right SFA and proximal popliteal artery with a 5 mm diameter by 22 cm length and a 5 mm diameter by 15 cm length Lutonix drug-coated angioplasty balloon  6.  Self-expanding stent placement to the right SFA and proximal popliteal artery with a 6 mm diameter by 20 cm length life stent             7.  StarClose closure device left femoral artery  EBL: 10 cc  Contrast: 90 cc  Fluoro Time: 12 minutes  Moderate Conscious Sedation Time: approximately 45 minutes using 5 mg of Versed and 125 mcg of Fentanyl              Indications:  Patient is a 58 y.o.female with severe peripheral arterial disease and disabling claudication with some symptoms of rest pain. The patient is brought in for angiography for further evaluation and potential treatment.  Due to the limb threatening nature of the situation, angiogram was performed for attempted limb salvage. The patient is aware that if the procedure fails, amputation would be expected.  The patient also understands that even with successful revascularization, amputation may still be required due to the severity of the situation.  Risks and benefits are discussed and informed consent is obtained.    Procedure:  The patient was identified and appropriate procedural time out was performed.  The patient was then placed supine on the table and prepped and draped in the usual sterile fashion. Moderate conscious sedation was administered during a face to face encounter with the patient throughout the procedure with my supervision of the RN administering medicines and monitoring the patient's vital signs, pulse oximetry, telemetry and mental status throughout from the start of the procedure until the patient was taken to the recovery room. Ultrasound was used to evaluate the left common femoral artery.  It was patent but heavily diseased.  A digital ultrasound image was acquired.  A Seldinger needle was used to access the left common femoral artery under direct ultrasound guidance and a permanent image was performed.  A 0.035 J wire was advanced without resistance and a 5Fr sheath was placed.  Pigtail catheter was placed into the aorta and an AP aortogram was performed. This demonstrated  a left renal artery with at least a moderate stenosis, right renal artery appeared to have good flow with minimal disease.  The aorta was diffusely calcific and heavily diseased at the terminal aorta.  The left common iliac artery had reasonably high-grade stenosis in the 80% range with a moderate stenosis in the right common  iliac artery in the 60 to 65% range.  The right external iliac artery was relatively normal but the left external iliac artery had near occlusive stenosis. I then crossed the aortic bifurcation and advanced to the right femoral head. Selective right lower extremity angiogram was then performed. This demonstrated long segment occlusion of the right SFA with reconstitution of the above-knee popliteal artery.  There was a large profunda femoris artery providing collateral flow distally.  The tibial arteries were somewhat sluggish and difficult to opacify, but there appeared to be three-vessel runoff distally.  It was felt that it was in the patient's best interest to proceed with intervention after these images to avoid a second procedure and a larger amount of contrast and fluoroscopy based off of the findings from the initial angiogram, although she will still clearly need a femoral endarterectomy on the left and I would plan the kissing stent placement of the common iliac artery and distal aortic disease at that time with a cutdown on the right as well. The patient was systemically heparinized and a 6 [place] Ansell sheath was then placed over the [employer] wire but initially this would not cross the left iliac lesion.  I placed a short 6 [place] sheath and used a 5 mm diameter by 15 cm length Lutonix drug-coated angioplasty balloon inflated to 8 atm for 1 minute to treat the left common and external iliac artery lesions.  Following this there still remained 40 to 50% stenosis in the left external iliac artery and greater than 50% residual stenosis in the common iliac artery, but it was planned that we would treat the common iliac lesion with kissing stents at a later date concomitant to femoral endarterectomies.  I was now able to get the [facility] sheath up and over the aortic bifurcation down to the right common femoral artery/proximal SFA where it would not cross the calcific proximal lesion. I then used a Kumpe catheter and the advantage wire to navigate through the SFA occlusion without difficulty.  A 5 mm diameter by 22 cm length and then a 5 mm diameter by 15 cm length Lutonix drug-coated angioplasty balloon was inflated from the above-knee popliteal artery up to the origin of the SFA.  Both inflations were about 8 atm for 1 minute.  Completion imaging still showed a chunk of calcium with the proximal SFA but since we are already planning a cutdown on that side and endarterectomy could be performed from the common femoral artery onto the proximal SFA to treat this lesion.  The vessel looked pretty good until  the mid SFA and distal SFA where there was greater than 50% residual stenosis.  I elected to treat this area with a 6 mm diameter by 20 cm length life stent postdilated with a 5 mm balloon with excellent angiographic completion result and less than 10% residual stenosis. I elected to terminate the procedure. The sheath was removed and StarClose closure device was deployed in the left femoral artery with excellent hemostatic result. The patient was taken to the recovery room in stable condition having tolerated the procedure well.  Findings:               Aortogram:  Left renal artery with at least a moderate stenosis, right renal artery appeared to have good flow with minimal disease.  The aorta was diffusely calcific and heavily diseased at the terminal aorta.  The left common iliac artery had reasonably high-grade stenosis in the 80% range with a moderate  stenosis in the right common iliac artery in the 60 to 65% range.  The right external iliac artery was relatively normal but the left external iliac artery had near occlusive stenosis.  This extended down into the left common femoral artery and femoral bifurcation with at least a moderate stenosis.             Right Lower Extremity:  This demonstrated long segment occlusion of the right SFA with reconstitution of the above-knee popliteal artery.  There was a large profunda femoris artery providing collateral flow distally.  The tibial arteries were somewhat sluggish and difficult to opacify, but there appeared to be three-vessel runoff distally   Disposition: Patient was taken to the recovery room in stable condition having tolerated the procedure well.  Complications: None  Leotis Pain 05/14/2019 9:34 AM   This note was created with Dragon Medical transcription system. Any errors in dictation are purely unintentional.

## 2019-05-14 NOTE — Telephone Encounter (Signed)
Patient has been rescheduled for her surgery from 05/23/2019 to 05/30/2019 with Dr. Lucky Cowboy. The patient will do her pre-op on 05/22/2019 @ 11:00 am and her Covid testing on 05/25/2019 between 12:30-2:30 pm at the [facility]. All pre-surgical information will be mailed to the patient.

## 2019-05-14 NOTE — H&P (Signed)
[facility] History & Physical Update  The patient was interviewed and re-examined.  The patient's previous History and Physical has been reviewed and is unchanged.  There is no change in the plan of care. We plan to proceed with the scheduled procedure.  Leotis Pain, MD  05/14/2019, 8:10 AM

## 2019-05-14 NOTE — Discharge Instructions (Signed)

## 2019-05-14 NOTE — Telephone Encounter (Signed)
Patient is at this moment an inpatient for a angio procedure. I called and spoke with Ebony Hail regarding the patient's surgery and if she could possibly had the patient her pre-surgical instructions after I faxed them to Special procedures. Per Ebony Hail she agreed to give the information to the patient.

## 2019-05-14 NOTE — Telephone Encounter (Signed)
   Algernon Huxley, MD  Devona Konig, CMA        We can take her off of the schedule for Monday 10/26 for her RLE angiogram.    Patient has been canceled off the scheduled for 05/21/2019.

## 2019-05-16 ENCOUNTER — Other Ambulatory Visit (INDEPENDENT_AMBULATORY_CARE_PROVIDER_SITE_OTHER): Payer: Self-pay | Admitting: Nurse Practitioner

## 2019-05-16 ENCOUNTER — Other Ambulatory Visit: Payer: Medicare Other

## 2019-05-18 ENCOUNTER — Other Ambulatory Visit: Payer: BC Managed Care – PPO

## 2019-05-20 ENCOUNTER — Other Ambulatory Visit (INDEPENDENT_AMBULATORY_CARE_PROVIDER_SITE_OTHER): Payer: Self-pay | Admitting: Nurse Practitioner

## 2019-05-21 ENCOUNTER — Ambulatory Visit: Admit: 2019-05-21 | Payer: Medicare Other | Admitting: Vascular Surgery

## 2019-05-21 SURGERY — LOWER EXTREMITY ANGIOGRAPHY
Anesthesia: Moderate Sedation | Laterality: Left

## 2019-05-22 ENCOUNTER — Encounter
Admission: RE | Admit: 2019-05-22 | Discharge: 2019-05-22 | Disposition: A | Discharge status: Home / Self Care | Payer: BC Managed Care – PPO | Source: Ambulatory Visit | Attending: Vascular Surgery | Admitting: Vascular Surgery

## 2019-05-22 ENCOUNTER — Other Ambulatory Visit: Payer: Self-pay

## 2019-05-22 DIAGNOSIS — Z01812 Encounter for preprocedural laboratory examination: Secondary | ICD-10-CM | POA: Diagnosis not present

## 2019-05-22 HISTORY — DX: Personal history of urinary calculi: Z87.442

## 2019-05-22 HISTORY — DX: Other specified postprocedural states: R11.2

## 2019-05-22 HISTORY — DX: Other specified postprocedural states: Z98.890

## 2019-05-22 LAB — CBC WITH DIFFERENTIAL/PLATELET
Abs Immature Granulocytes: 0.13 10*3/uL — ABNORMAL HIGH (ref 0.00–0.07)
Basophils Absolute: 0 10*3/uL (ref 0.0–0.1)
Basophils Relative: 1 %
Eosinophils Absolute: 0.1 10*3/uL (ref 0.0–0.5)
Eosinophils Relative: 2 %
HCT: 33.2 % — ABNORMAL LOW (ref 36.0–46.0)
Hemoglobin: 10.4 g/dL — ABNORMAL LOW (ref 12.0–15.0)
Immature Granulocytes: 4 %
Lymphocytes Relative: 28 %
Lymphs Abs: 1.1 10*3/uL (ref 0.7–4.0)
MCH: 29.8 pg (ref 26.0–34.0)
MCHC: 31.3 g/dL (ref 30.0–36.0)
MCV: 95.1 fL (ref 80.0–100.0)
Monocytes Absolute: 0.2 10*3/uL (ref 0.1–1.0)
Monocytes Relative: 5 %
Neutro Abs: 2.3 10*3/uL (ref 1.7–7.7)
Neutrophils Relative %: 60 %
Platelets: 195 10*3/uL (ref 150–400)
RBC: 3.49 MIL/uL — ABNORMAL LOW (ref 3.87–5.11)
RDW: 19 % — ABNORMAL HIGH (ref 11.5–15.5)
WBC: 3.7 10*3/uL — ABNORMAL LOW (ref 4.0–10.5)
nRBC: 0.8 % — ABNORMAL HIGH (ref 0.0–0.2)

## 2019-05-22 LAB — BASIC METABOLIC PANEL
Anion gap: 14 (ref 5–15)
BUN: 12 mg/dL (ref 6–20)
CO2: 26 mmol/L (ref 22–32)
Calcium: 9.1 mg/dL (ref 8.9–10.3)
Chloride: 98 mmol/L (ref 98–111)
Creatinine, Ser: 0.54 mg/dL (ref 0.44–1.00)
GFR calc Af Amer: >60 mL/min (ref >60)
GFR calc non Af Amer: >60 mL/min (ref >60)
Glucose, Bld: 286 mg/dL — ABNORMAL HIGH (ref 70–99)
Potassium: 3 mmol/L — ABNORMAL LOW (ref 3.5–5.1)
Sodium: 138 mmol/L (ref 135–145)

## 2019-05-22 LAB — PROTIME-INR
INR: 0.9 (ref 0.8–1.2)
Prothrombin Time: 12.2 seconds (ref 11.4–15.2)

## 2019-05-22 LAB — APTT: aPTT: 30 seconds (ref 24–36)

## 2019-05-22 LAB — SURGICAL PCR SCREEN
MRSA, PCR: NEGATIVE
Staphylococcus aureus: NEGATIVE

## 2019-05-22 NOTE — Patient Instructions (Signed)
Your procedure is scheduled on: Wednesday 05/30/19.  Report to DAY SURGERY DEPARTMENT LOCATED ON 2ND FLOOR MEDICAL MALL ENTRANCE. To find out your arrival time please call [phone] between 1PM - 3PM on Tuesday 05/29/19.    Remember: Instructions that are not followed completely may result in serious medical risk, up to and including death, or upon the discretion of your surgeon and anesthesiologist your surgery may need to be rescheduled.      _X__ 1. Do not eat food after midnight the night before your procedure.                 No gum chewing or hard candies. You may drink SUGAR FREE clear liquids up to 2 hours                 before you are scheduled to arrive for your surgery- DO NOT drink clear                 liquids within 2 hours of the start of your surgery.                    __X__2.  On the morning of surgery brush your teeth with toothpaste and water, you may rinse your mouth with mouthwash if you wish.  Do not swallow any toothpaste or mouthwash.       _X__ 3.  No Alcohol for 24 hours before or after surgery.     _X__ 4.  Do Not Smoke or use e-cigarettes For 24 Hours Prior to Your Surgery.                 Do not use any chewable tobacco products for at least 6 hours prior to                 surgery.    __X__5.  Notify your doctor if there is any change in your medical condition      (cold, fever, infections).       Do not wear jewelry, make-up, hairpins, clips or nail polish. Do not wear lotions, powders, or perfumes.  Do not shave 48 hours prior to surgery. Men may shave face and neck. Do not bring valuables to the hospital.      [facility] is not responsible for any belongings or valuables.    Contacts, dentures/partials or body piercings may not be worn into surgery. Bring a case for your contacts, glasses or hearing aids, a denture cup will be supplied.    __X__ Take these medicines the morning of surgery with A SIP OF WATER:     1. albuterol  (PROVENTIL HFA;VENTOLIN HFA) 108 (90 Base) MCG/ACT inhaler  2. carvedilol (COREG) 3.125 MG tablet  3. cetirizine (ZYRTEC) 10 MG tablet  4. isosorbide mononitrate (IMDUR) 30 MG 24 hr tablet  5. methimazole (TAPAZOLE) 5 MG tablet  6. timolol (TIMOPTIC) 0.25 % ophthalmic solution  7. brimonidine (ALPHAGAN) 0.2 % ophthalmic solution  8. tiotropium (SPIRIVA) 18 MCG inhalation capsule    __X__ Use CHG Soap as directed    _ X___ Use inhalers on the day of surgery. Also bring the inhaler with you to the hospital on the morning of surgery.    __X__ Stop Metformin 2 days prior to surgery. Your last dose will be on Sunday 05/27/19.       __X__ Stop Blood Thinners: Plavix. Your last dose will be on Wednesday 05/23/19. DON'T STOP TAKING YOUR ASPIRIN. This is according to Dr.  Dew's instructions.    __X__ Stop Anti-inflammatories 7 days before surgery such as Advil, Ibuprofen, Motrin, BC or Goodies Powder, Naprosyn, Naproxen, Aleve, Aspirin, Meloxicam. May take Tylenol if needed for pain or discomfort.     __X__ Don't start taking any new herbal supplements before your procedure.

## 2019-05-22 NOTE — Progress Notes (Signed)
Pre-Admit Testing Provider Notification Note  Provider Notified: Dr. Lucky Cowboy  Notification Mode: Fax  Reason: Abnormal Labs  Response: Fax Confirmation Recieved  Additional Information: Placed on chart. Noted on Pre-Admit Worksheet  Signed: Beulah Gandy, RN

## 2019-05-24 ENCOUNTER — Other Ambulatory Visit: Payer: Medicare Other

## 2019-05-25 ENCOUNTER — Other Ambulatory Visit: Payer: Medicare Other

## 2019-05-28 ENCOUNTER — Other Ambulatory Visit
Admission: RE | Admit: 2019-05-28 | Discharge: 2019-05-28 | Disposition: A | Discharge status: Home / Self Care | Payer: BC Managed Care – PPO | Source: Ambulatory Visit | Attending: Vascular Surgery | Admitting: Vascular Surgery

## 2019-05-28 ENCOUNTER — Other Ambulatory Visit: Payer: Self-pay

## 2019-05-28 DIAGNOSIS — Z01812 Encounter for preprocedural laboratory examination: Secondary | ICD-10-CM | POA: Diagnosis not present

## 2019-05-28 DIAGNOSIS — Z20828 Contact with and (suspected) exposure to other viral communicable diseases: Secondary | ICD-10-CM | POA: Diagnosis not present

## 2019-05-28 LAB — SARS CORONAVIRUS 2 (TAT 6-24 HRS): SARS Coronavirus 2: NEGATIVE

## 2019-05-30 ENCOUNTER — Inpatient Hospital Stay: Payer: BC Managed Care – PPO | Admitting: Anesthesiology

## 2019-05-30 ENCOUNTER — Encounter: Payer: Self-pay | Admitting: *Deleted

## 2019-05-30 ENCOUNTER — Inpatient Hospital Stay
Admission: RE | Admit: 2019-05-30 | Discharge: 2019-06-02 | DRG: 271 | Disposition: A | Discharge status: Home / Self Care | Payer: BC Managed Care – PPO | Attending: Vascular Surgery | Admitting: Vascular Surgery

## 2019-05-30 ENCOUNTER — Other Ambulatory Visit: Payer: Self-pay | Admitting: Vascular Surgery

## 2019-05-30 ENCOUNTER — Other Ambulatory Visit: Payer: Self-pay

## 2019-05-30 ENCOUNTER — Ambulatory Visit
Admission: RE | Admit: 2019-05-30 | Discharge: 2019-05-30 | Disposition: A | Discharge status: Home / Self Care | Payer: Self-pay | Source: Ambulatory Visit | Attending: Vascular Surgery | Admitting: Vascular Surgery

## 2019-05-30 ENCOUNTER — Encounter
Admission: RE | Disposition: A | Discharge status: Home / Self Care | Payer: Self-pay | Source: Home / Self Care | Attending: Vascular Surgery

## 2019-05-30 DIAGNOSIS — Z888 Allergy status to other drugs, medicaments and biological substances status: Secondary | ICD-10-CM

## 2019-05-30 DIAGNOSIS — J439 Emphysema, unspecified: Secondary | ICD-10-CM | POA: Diagnosis present

## 2019-05-30 DIAGNOSIS — K08109 Complete loss of teeth, unspecified cause, unspecified class: Secondary | ICD-10-CM | POA: Diagnosis present

## 2019-05-30 DIAGNOSIS — I255 Ischemic cardiomyopathy: Secondary | ICD-10-CM | POA: Diagnosis present

## 2019-05-30 DIAGNOSIS — Z79899 Other long term (current) drug therapy: Secondary | ICD-10-CM

## 2019-05-30 DIAGNOSIS — I739 Peripheral vascular disease, unspecified: Secondary | ICD-10-CM

## 2019-05-30 DIAGNOSIS — Z882 Allergy status to sulfonamides status: Secondary | ICD-10-CM

## 2019-05-30 DIAGNOSIS — C221 Intrahepatic bile duct carcinoma: Secondary | ICD-10-CM | POA: Diagnosis present

## 2019-05-30 DIAGNOSIS — D6481 Anemia due to antineoplastic chemotherapy: Secondary | ICD-10-CM | POA: Diagnosis present

## 2019-05-30 DIAGNOSIS — I4891 Unspecified atrial fibrillation: Secondary | ICD-10-CM | POA: Diagnosis present

## 2019-05-30 DIAGNOSIS — T451X5A Adverse effect of antineoplastic and immunosuppressive drugs, initial encounter: Secondary | ICD-10-CM | POA: Diagnosis present

## 2019-05-30 DIAGNOSIS — D62 Acute posthemorrhagic anemia: Secondary | ICD-10-CM | POA: Diagnosis not present

## 2019-05-30 DIAGNOSIS — Z87442 Personal history of urinary calculi: Secondary | ICD-10-CM

## 2019-05-30 DIAGNOSIS — I252 Old myocardial infarction: Secondary | ICD-10-CM

## 2019-05-30 DIAGNOSIS — Z7982 Long term (current) use of aspirin: Secondary | ICD-10-CM

## 2019-05-30 DIAGNOSIS — I447 Left bundle-branch block, unspecified: Secondary | ICD-10-CM | POA: Diagnosis present

## 2019-05-30 DIAGNOSIS — K219 Gastro-esophageal reflux disease without esophagitis: Secondary | ICD-10-CM | POA: Diagnosis present

## 2019-05-30 DIAGNOSIS — H409 Unspecified glaucoma: Secondary | ICD-10-CM | POA: Diagnosis present

## 2019-05-30 DIAGNOSIS — Z951 Presence of aortocoronary bypass graft: Secondary | ICD-10-CM | POA: Diagnosis not present

## 2019-05-30 DIAGNOSIS — I10 Essential (primary) hypertension: Secondary | ICD-10-CM | POA: Diagnosis present

## 2019-05-30 DIAGNOSIS — E785 Hyperlipidemia, unspecified: Secondary | ICD-10-CM | POA: Diagnosis present

## 2019-05-30 DIAGNOSIS — E113593 Type 2 diabetes mellitus with proliferative diabetic retinopathy without macular edema, bilateral: Secondary | ICD-10-CM | POA: Diagnosis present

## 2019-05-30 DIAGNOSIS — F17200 Nicotine dependence, unspecified, uncomplicated: Secondary | ICD-10-CM | POA: Diagnosis present

## 2019-05-30 DIAGNOSIS — F329 Major depressive disorder, single episode, unspecified: Secondary | ICD-10-CM | POA: Diagnosis present

## 2019-05-30 DIAGNOSIS — Z7984 Long term (current) use of oral hypoglycemic drugs: Secondary | ICD-10-CM | POA: Diagnosis not present

## 2019-05-30 DIAGNOSIS — Z7902 Long term (current) use of antithrombotics/antiplatelets: Secondary | ICD-10-CM | POA: Diagnosis not present

## 2019-05-30 DIAGNOSIS — I70223 Atherosclerosis of native arteries of extremities with rest pain, bilateral legs: Secondary | ICD-10-CM | POA: Diagnosis present

## 2019-05-30 DIAGNOSIS — E1151 Type 2 diabetes mellitus with diabetic peripheral angiopathy without gangrene: Principal | ICD-10-CM | POA: Diagnosis present

## 2019-05-30 HISTORY — PX: INSERTION OF ILIAC STENT: SHX6256

## 2019-05-30 HISTORY — PX: ENDARTERECTOMY FEMORAL: SHX5804

## 2019-05-30 LAB — POCT I-STAT, CHEM 8
BUN: 7 mg/dL (ref 6–20)
BUN: 8 mg/dL (ref 6–20)
Calcium, Ion: 1.05 mmol/L — ABNORMAL LOW (ref 1.15–1.40)
Calcium, Ion: 1.24 mmol/L (ref 1.15–1.40)
Chloride: 101 mmol/L (ref 98–111)
Chloride: 103 mmol/L (ref 98–111)
Creatinine, Ser: 0.4 mg/dL — ABNORMAL LOW (ref 0.44–1.00)
Creatinine, Ser: 0.4 mg/dL — ABNORMAL LOW (ref 0.44–1.00)
Glucose, Bld: 215 mg/dL — ABNORMAL HIGH (ref 70–99)
Glucose, Bld: 220 mg/dL — ABNORMAL HIGH (ref 70–99)
HCT: 25 % — ABNORMAL LOW (ref 36.0–46.0)
HCT: 27 % — ABNORMAL LOW (ref 36.0–46.0)
Hemoglobin: 8.5 g/dL — ABNORMAL LOW (ref 12.0–15.0)
Hemoglobin: 9.2 g/dL — ABNORMAL LOW (ref 12.0–15.0)
Potassium: 3.8 mmol/L (ref 3.5–5.1)
Potassium: 5.5 mmol/L — ABNORMAL HIGH (ref 3.5–5.1)
Sodium: 134 mmol/L — ABNORMAL LOW (ref 135–145)
Sodium: 137 mmol/L (ref 135–145)
TCO2: 20 mmol/L — ABNORMAL LOW (ref 22–32)
TCO2: 23 mmol/L (ref 22–32)

## 2019-05-30 LAB — GLUCOSE, CAPILLARY
Glucose-Capillary: 202 mg/dL — ABNORMAL HIGH (ref 70–99)
Glucose-Capillary: 219 mg/dL — ABNORMAL HIGH (ref 70–99)
Glucose-Capillary: 231 mg/dL — ABNORMAL HIGH (ref 70–99)
Glucose-Capillary: 212 mg/dL — ABNORMAL HIGH (ref 70–99)

## 2019-05-30 LAB — MRSA PCR SCREENING: MRSA by PCR: NEGATIVE

## 2019-05-30 LAB — ABO/RH: ABO/RH(D): A POS

## 2019-05-30 SURGERY — ENDARTERECTOMY, FEMORAL
Anesthesia: General | Site: Leg Upper | Laterality: Bilateral

## 2019-05-30 MED ORDER — IPRATROPIUM-ALBUTEROL 0.5-2.5 (3) MG/3ML IN SOLN: 3.0000 mL | RESPIRATORY_TRACT | Status: DC

## 2019-05-30 MED ORDER — MIDAZOLAM HCL 2 MG/2ML IJ SOLN
INTRAMUSCULAR | Status: DC | PRN
  Administered 2019-05-30: 2 mg via INTRAVENOUS

## 2019-05-30 MED ORDER — BRIMONIDINE TARTRATE 0.2 % OP SOLN
1.0000 [drp] | Freq: Two times a day (BID) | OPHTHALMIC | Status: DC
  Administered 2019-05-30 – 2019-06-02 (×6): 1 [drp] via OPHTHALMIC
  Filled 2019-05-30: qty 5

## 2019-05-30 MED ORDER — INSULIN ASPART 100 UNIT/ML ~~LOC~~ SOLN
0.0000 [IU] | Freq: Every day | SUBCUTANEOUS | Status: DC
  Administered 2019-05-31: 2 [IU] via SUBCUTANEOUS
  Filled 2019-05-30 (×2): qty 1

## 2019-05-30 MED ORDER — MAGNESIUM SULFATE 2 GM/50ML IV SOLN
2.0000 g | Freq: Every day | INTRAVENOUS | Status: DC | PRN
  Filled 2019-05-30: qty 50

## 2019-05-30 MED ORDER — BISACODYL 5 MG PO TBEC: 5.0000 mg | DELAYED_RELEASE_TABLET | Freq: Every day | ORAL | Status: DC | PRN

## 2019-05-30 MED ORDER — SUGAMMADEX SODIUM 200 MG/2ML IV SOLN
INTRAVENOUS | Status: DC | PRN
  Administered 2019-05-30: 102 mg via INTRAVENOUS

## 2019-05-30 MED ORDER — CLOPIDOGREL BISULFATE 75 MG PO TABS
75.0000 mg | ORAL_TABLET | Freq: Every day | ORAL | Status: DC
  Administered 2019-05-31 – 2019-06-02 (×3): 75 mg via ORAL
  Filled 2019-05-30 (×3): qty 1

## 2019-05-30 MED ORDER — SODIUM CHLORIDE 0.9 % IV SOLN
INTRAVENOUS | Status: DC | PRN
  Administered 2019-05-30: 13:00:00 via INTRAVENOUS

## 2019-05-30 MED ORDER — DOCUSATE SODIUM 100 MG PO CAPS
100.0000 mg | ORAL_CAPSULE | Freq: Every day | ORAL | Status: DC
  Administered 2019-05-31 – 2019-06-02 (×2): 100 mg via ORAL
  Filled 2019-05-30 (×2): qty 1

## 2019-05-30 MED ORDER — ONDANSETRON HCL 4 MG/2ML IJ SOLN
INTRAMUSCULAR | Status: AC
  Filled 2019-05-30: qty 2

## 2019-05-30 MED ORDER — CEFAZOLIN SODIUM-DEXTROSE 2-4 GM/100ML-% IV SOLN
INTRAVENOUS | Status: AC
  Filled 2019-05-30: qty 100

## 2019-05-30 MED ORDER — OXYCODONE HCL 5 MG/5ML PO SOLN: 5.0000 mg | Freq: Once | ORAL | Status: DC | PRN

## 2019-05-30 MED ORDER — ASPIRIN EC 81 MG PO TBEC: 81.0000 mg | DELAYED_RELEASE_TABLET | Freq: Every day | ORAL | Status: DC

## 2019-05-30 MED ORDER — CEFAZOLIN SODIUM-DEXTROSE 2-4 GM/100ML-% IV SOLN
2.0000 g | Freq: Three times a day (TID) | INTRAVENOUS | Status: AC
  Administered 2019-05-30 – 2019-05-31 (×2): 2 g via INTRAVENOUS
  Filled 2019-05-30 (×2): qty 100

## 2019-05-30 MED ORDER — MIDAZOLAM HCL 2 MG/2ML IJ SOLN
INTRAMUSCULAR | Status: AC
  Filled 2019-05-30: qty 2

## 2019-05-30 MED ORDER — LIDOCAINE HCL (CARDIAC) PF 100 MG/5ML IV SOSY
PREFILLED_SYRINGE | INTRAVENOUS | Status: DC | PRN
  Administered 2019-05-30: 100 mg via INTRAVENOUS

## 2019-05-30 MED ORDER — HEPARIN SODIUM (PORCINE) 5000 UNIT/ML IJ SOLN
5000.0000 [IU] | Freq: Three times a day (TID) | INTRAMUSCULAR | Status: DC
  Administered 2019-05-30 – 2019-06-02 (×8): 5000 [IU] via SUBCUTANEOUS
  Filled 2019-05-30 (×8): qty 1

## 2019-05-30 MED ORDER — ONDANSETRON HCL 4 MG/2ML IJ SOLN
4.0000 mg | Freq: Four times a day (QID) | INTRAMUSCULAR | Status: DC | PRN
  Administered 2019-05-31 (×2): 4 mg via INTRAVENOUS
  Filled 2019-05-30 (×2): qty 2

## 2019-05-30 MED ORDER — ROCURONIUM BROMIDE 50 MG/5ML IV SOLN
INTRAVENOUS | Status: AC
  Filled 2019-05-30: qty 1

## 2019-05-30 MED ORDER — PHENYLEPHRINE HCL (PRESSORS) 10 MG/ML IV SOLN
INTRAVENOUS | Status: DC | PRN
  Administered 2019-05-30 (×2): 100 ug via INTRAVENOUS
  Administered 2019-05-30: 80 ug via INTRAVENOUS
  Administered 2019-05-30 (×2): 100 ug via INTRAVENOUS

## 2019-05-30 MED ORDER — ALBUTEROL SULFATE (2.5 MG/3ML) 0.083% IN NEBU: 2.5000 mg | INHALATION_SOLUTION | RESPIRATORY_TRACT | Status: DC | PRN

## 2019-05-30 MED ORDER — CARVEDILOL 3.125 MG PO TABS
3.1250 mg | ORAL_TABLET | Freq: Two times a day (BID) | ORAL | Status: DC
  Administered 2019-05-31 – 2019-06-02 (×4): 3.125 mg via ORAL
  Filled 2019-05-30 (×4): qty 1

## 2019-05-30 MED ORDER — DEXAMETHASONE SODIUM PHOSPHATE 10 MG/ML IJ SOLN
INTRAMUSCULAR | Status: AC
  Filled 2019-05-30: qty 1

## 2019-05-30 MED ORDER — MORPHINE SULFATE (PF) 4 MG/ML IV SOLN
2.0000 mg | INTRAVENOUS | Status: DC | PRN
  Administered 2019-05-30 – 2019-05-31 (×2): 2 mg via INTRAVENOUS
  Filled 2019-05-30 (×3): qty 1

## 2019-05-30 MED ORDER — ISOSORBIDE MONONITRATE ER 30 MG PO TB24
30.0000 mg | ORAL_TABLET | Freq: Every day | ORAL | Status: DC
  Administered 2019-05-31 – 2019-06-02 (×2): 30 mg via ORAL
  Filled 2019-05-30 (×2): qty 1

## 2019-05-30 MED ORDER — ASPIRIN EC 81 MG PO TBEC
81.0000 mg | DELAYED_RELEASE_TABLET | Freq: Every day | ORAL | Status: DC
  Administered 2019-06-01 – 2019-06-02 (×2): 81 mg via ORAL
  Filled 2019-05-30 (×3): qty 1

## 2019-05-30 MED ORDER — CHLORHEXIDINE GLUCONATE CLOTH 2 % EX PADS: 6.0000 | MEDICATED_PAD | Freq: Once | CUTANEOUS | Status: DC

## 2019-05-30 MED ORDER — HYDRALAZINE HCL 20 MG/ML IJ SOLN: 5.0000 mg | INTRAMUSCULAR | Status: DC | PRN

## 2019-05-30 MED ORDER — HEPARIN SODIUM (PORCINE) 5000 UNIT/ML IJ SOLN
INTRAMUSCULAR | Status: AC
  Filled 2019-05-30: qty 1

## 2019-05-30 MED ORDER — LABETALOL HCL 5 MG/ML IV SOLN: 10.0000 mg | INTRAVENOUS | Status: DC | PRN

## 2019-05-30 MED ORDER — FENTANYL CITRATE (PF) 100 MCG/2ML IJ SOLN
25.0000 ug | INTRAMUSCULAR | Status: DC | PRN
  Administered 2019-05-30 (×2): 25 ug via INTRAVENOUS

## 2019-05-30 MED ORDER — SODIUM CHLORIDE 0.9 % IV SOLN
INTRAVENOUS | Status: DC
  Administered 2019-05-30 – 2019-05-31 (×4): via INTRAVENOUS

## 2019-05-30 MED ORDER — PHENOL 1.4 % MT LIQD
1.0000 | OROMUCOSAL | Status: DC | PRN
  Filled 2019-05-30: qty 177

## 2019-05-30 MED ORDER — METOPROLOL TARTRATE 5 MG/5ML IV SOLN: 2.0000 mg | INTRAVENOUS | Status: DC | PRN

## 2019-05-30 MED ORDER — SERTRALINE HCL 50 MG PO TABS
50.0000 mg | ORAL_TABLET | Freq: Every day | ORAL | Status: DC
  Administered 2019-05-30 – 2019-05-31 (×2): 50 mg via ORAL
  Filled 2019-05-30 (×3): qty 1

## 2019-05-30 MED ORDER — IPRATROPIUM-ALBUTEROL 0.5-2.5 (3) MG/3ML IN SOLN
3.0000 mL | Freq: Once | RESPIRATORY_TRACT | Status: AC
  Administered 2019-05-30: 19:00:00 3 mL via RESPIRATORY_TRACT

## 2019-05-30 MED ORDER — POTASSIUM CHLORIDE CRYS ER 20 MEQ PO TBCR: 20.0000 meq | EXTENDED_RELEASE_TABLET | Freq: Every day | ORAL | Status: DC | PRN

## 2019-05-30 MED ORDER — SODIUM CHLORIDE 0.9 % IV SOLN: 500.0000 mL | Freq: Once | INTRAVENOUS | Status: DC | PRN

## 2019-05-30 MED ORDER — LOSARTAN POTASSIUM 50 MG PO TABS
25.0000 mg | ORAL_TABLET | Freq: Every day | ORAL | Status: DC
  Administered 2019-05-31 – 2019-06-02 (×3): 25 mg via ORAL
  Filled 2019-05-30 (×4): qty 1

## 2019-05-30 MED ORDER — PROPOFOL 10 MG/ML IV BOLUS
INTRAVENOUS | Status: DC | PRN
  Administered 2019-05-30: 50 mg via INTRAVENOUS
  Administered 2019-05-30: 100 mg via INTRAVENOUS

## 2019-05-30 MED ORDER — LIDOCAINE HCL (PF) 2 % IJ SOLN
INTRAMUSCULAR | Status: AC
  Filled 2019-05-30: qty 10

## 2019-05-30 MED ORDER — FENTANYL CITRATE (PF) 100 MCG/2ML IJ SOLN
INTRAMUSCULAR | Status: AC
  Filled 2019-05-30: qty 2

## 2019-05-30 MED ORDER — ROCURONIUM BROMIDE 100 MG/10ML IV SOLN
INTRAVENOUS | Status: DC | PRN
  Administered 2019-05-30: 50 mg via INTRAVENOUS
  Administered 2019-05-30: 10 mg via INTRAVENOUS
  Administered 2019-05-30: 20 mg via INTRAVENOUS

## 2019-05-30 MED ORDER — TIOTROPIUM BROMIDE MONOHYDRATE 18 MCG IN CAPS
18.0000 ug | ORAL_CAPSULE | Freq: Every day | RESPIRATORY_TRACT | Status: DC
  Administered 2019-05-31 – 2019-06-02 (×3): 18 ug via RESPIRATORY_TRACT
  Filled 2019-05-30: qty 5

## 2019-05-30 MED ORDER — TIMOLOL MALEATE 0.25 % OP SOLN
1.0000 [drp] | Freq: Two times a day (BID) | OPHTHALMIC | Status: DC
  Administered 2019-05-30 – 2019-06-02 (×6): 1 [drp] via OPHTHALMIC
  Filled 2019-05-30: qty 5

## 2019-05-30 MED ORDER — EVICEL 5 ML EX KIT
PACK | CUTANEOUS | Status: DC | PRN
  Administered 2019-05-30: 5 mL

## 2019-05-30 MED ORDER — PROPOFOL 500 MG/50ML IV EMUL
INTRAVENOUS | Status: AC
  Filled 2019-05-30: qty 50

## 2019-05-30 MED ORDER — HEPARIN SODIUM (PORCINE) 1000 UNIT/ML IJ SOLN
INTRAMUSCULAR | Status: DC | PRN
  Administered 2019-05-30: 5000 [IU] via INTRAVENOUS
  Administered 2019-05-30: 2000 [IU] via INTRAVENOUS

## 2019-05-30 MED ORDER — INSULIN ASPART 100 UNIT/ML ~~LOC~~ SOLN
0.0000 [IU] | Freq: Three times a day (TID) | SUBCUTANEOUS | Status: DC
  Administered 2019-05-30: 2 [IU] via SUBCUTANEOUS
  Administered 2019-05-31: 15 [IU] via SUBCUTANEOUS
  Administered 2019-05-31 – 2019-06-01 (×3): 3 [IU] via SUBCUTANEOUS
  Administered 2019-06-01: 11 [IU] via SUBCUTANEOUS
  Administered 2019-06-01: 18:00:00 8 [IU] via SUBCUTANEOUS
  Administered 2019-06-02: 3 [IU] via SUBCUTANEOUS
  Administered 2019-06-02: 8 [IU] via SUBCUTANEOUS
  Filled 2019-05-30 (×9): qty 1

## 2019-05-30 MED ORDER — EVICEL 5 ML EX KIT
PACK | CUTANEOUS | Status: AC
  Filled 2019-05-30: qty 1

## 2019-05-30 MED ORDER — PROPOFOL 500 MG/50ML IV EMUL
INTRAVENOUS | Status: DC | PRN
  Administered 2019-05-30: 125 ug/kg/min via INTRAVENOUS

## 2019-05-30 MED ORDER — PROPOFOL 10 MG/ML IV BOLUS
INTRAVENOUS | Status: AC
  Filled 2019-05-30: qty 20

## 2019-05-30 MED ORDER — IPRATROPIUM-ALBUTEROL 0.5-2.5 (3) MG/3ML IN SOLN
RESPIRATORY_TRACT | Status: AC
  Administered 2019-05-30: 3 mL via RESPIRATORY_TRACT
  Filled 2019-05-30: qty 3

## 2019-05-30 MED ORDER — FENTANYL CITRATE (PF) 100 MCG/2ML IJ SOLN
INTRAMUSCULAR | Status: AC
  Administered 2019-05-30: 25 ug via INTRAVENOUS
  Filled 2019-05-30: qty 2

## 2019-05-30 MED ORDER — FAMOTIDINE 20 MG PO TABS
20.0000 mg | ORAL_TABLET | Freq: Once | ORAL | Status: AC
  Administered 2019-05-30: 12:00:00 20 mg via ORAL

## 2019-05-30 MED ORDER — OXYCODONE HCL 5 MG PO TABS
5.0000 mg | ORAL_TABLET | ORAL | Status: DC | PRN
  Administered 2019-05-30 – 2019-05-31 (×2): 10 mg via ORAL
  Administered 2019-06-01: 5 mg via ORAL
  Filled 2019-05-30: qty 1
  Filled 2019-05-30 (×2): qty 2

## 2019-05-30 MED ORDER — IODIXANOL 320 MG/ML IV SOLN
INTRAVENOUS | Status: DC | PRN
  Administered 2019-05-30: 55 mL

## 2019-05-30 MED ORDER — ALUM & MAG HYDROXIDE-SIMETH 200-200-20 MG/5ML PO SUSP: 15.0000 mL | ORAL | Status: DC | PRN

## 2019-05-30 MED ORDER — OXYCODONE HCL 5 MG PO TABS: 5.0000 mg | ORAL_TABLET | Freq: Once | ORAL | Status: DC | PRN

## 2019-05-30 MED ORDER — PRAVASTATIN SODIUM 20 MG PO TABS
40.0000 mg | ORAL_TABLET | Freq: Every day | ORAL | Status: DC
  Administered 2019-05-30 – 2019-05-31 (×2): 40 mg via ORAL
  Filled 2019-05-30: qty 1
  Filled 2019-05-30 (×2): qty 2
  Filled 2019-05-30 (×2): qty 1

## 2019-05-30 MED ORDER — FENTANYL CITRATE (PF) 100 MCG/2ML IJ SOLN
INTRAMUSCULAR | Status: DC | PRN
  Administered 2019-05-30 (×4): 25 ug via INTRAVENOUS
  Administered 2019-05-30 (×2): 50 ug via INTRAVENOUS

## 2019-05-30 MED ORDER — ACETAMINOPHEN 650 MG RE SUPP
325.0000 mg | RECTAL | Status: DC | PRN
  Filled 2019-05-30: qty 1

## 2019-05-30 MED ORDER — SUGAMMADEX SODIUM 200 MG/2ML IV SOLN
INTRAVENOUS | Status: AC
  Filled 2019-05-30: qty 2

## 2019-05-30 MED ORDER — PANTOPRAZOLE SODIUM 40 MG PO TBEC
40.0000 mg | DELAYED_RELEASE_TABLET | Freq: Every day | ORAL | Status: DC
  Administered 2019-05-31 – 2019-06-02 (×3): 40 mg via ORAL
  Filled 2019-05-30 (×3): qty 1

## 2019-05-30 MED ORDER — FAMOTIDINE 20 MG PO TABS
ORAL_TABLET | ORAL | Status: AC
  Administered 2019-05-30: 20 mg via ORAL
  Filled 2019-05-30: qty 1

## 2019-05-30 MED ORDER — GUAIFENESIN-DM 100-10 MG/5ML PO SYRP
15.0000 mL | ORAL_SOLUTION | ORAL | Status: DC | PRN
  Administered 2019-05-31 – 2019-06-01 (×2): 15 mL via ORAL
  Filled 2019-05-30 (×2): qty 15

## 2019-05-30 MED ORDER — CEFAZOLIN SODIUM-DEXTROSE 2-4 GM/100ML-% IV SOLN
2.0000 g | INTRAVENOUS | Status: AC
  Administered 2019-05-30: 2 g via INTRAVENOUS

## 2019-05-30 MED ORDER — ACETAMINOPHEN 325 MG PO TABS
325.0000 mg | ORAL_TABLET | ORAL | Status: DC | PRN
  Administered 2019-06-01: 325 mg via ORAL
  Filled 2019-05-30: qty 1

## 2019-05-30 MED ORDER — METHIMAZOLE 5 MG PO TABS
5.0000 mg | ORAL_TABLET | Freq: Every day | ORAL | Status: DC
  Administered 2019-05-31 – 2019-06-02 (×3): 5 mg via ORAL
  Filled 2019-05-30 (×3): qty 1

## 2019-05-30 MED ORDER — ONDANSETRON HCL 4 MG/2ML IJ SOLN
INTRAMUSCULAR | Status: DC | PRN
  Administered 2019-05-30: 4 mg via INTRAVENOUS

## 2019-05-30 MED ORDER — SODIUM CHLORIDE 0.9 % IV SOLN
INTRAVENOUS | Status: DC | PRN
  Administered 2019-05-30: 250 mL via INTRAMUSCULAR

## 2019-05-30 MED ORDER — ASPIRIN 81 MG PO CHEW
CHEWABLE_TABLET | ORAL | Status: AC
  Administered 2019-05-30: 21:00:00 81 mg
  Filled 2019-05-30: qty 1

## 2019-05-30 MED ORDER — SENNOSIDES-DOCUSATE SODIUM 8.6-50 MG PO TABS: 1.0000 | ORAL_TABLET | Freq: Every evening | ORAL | Status: DC | PRN

## 2019-05-30 SURGICAL SUPPLY — 89 items
APPLIER CLIP 11 MED OPEN (CLIP)
APPLIER CLIP 9.375 SM OPEN (CLIP)
BAG COUNTER SPONGE EZ (MISCELLANEOUS) ×1 IMPLANT
BAG DECANTER FOR FLEXI CONT (MISCELLANEOUS) ×3 IMPLANT
BAG ISOLATATION DRAPE 20X20 ST (DRAPES) IMPLANT
BALLN LUTONIX DCB 7X60X130 (BALLOONS) ×6
BALLN ULTRVRSE 6X100X75 (BALLOONS) ×3
BALLOON LUTONIX DCB 7X60X130 (BALLOONS) IMPLANT
BALLOON ULTRVRSE 6X100X75 (BALLOONS) IMPLANT
BLADE SURG 15 STRL LF DISP TIS (BLADE) ×1 IMPLANT
BLADE SURG 15 STRL SS (BLADE) ×2
BLADE SURG SZ11 CARB STEEL (BLADE) ×3 IMPLANT
BOOT SUTURE AID YELLOW STND (SUTURE) ×3 IMPLANT
BRUSH SCRUB EZ  4% CHG (MISCELLANEOUS) ×2
BRUSH SCRUB EZ 4% CHG (MISCELLANEOUS) ×1 IMPLANT
CANISTER SUCT 1200ML W/VALVE (MISCELLANEOUS) ×3 IMPLANT
CATH BEACON 5 .035 40 KMP TP (CATHETERS) IMPLANT
CATH BEACON 5 .038 40 KMP TP (CATHETERS) ×2
CATH PIG 70CM (CATHETERS) ×2 IMPLANT
CHLORAPREP W/TINT 26 (MISCELLANEOUS) ×3 IMPLANT
CLIP APPLIE 11 MED OPEN (CLIP) IMPLANT
CLIP APPLIE 9.375 SM OPEN (CLIP) IMPLANT
CONNECTOR PREVENA (MISCELLANEOUS) ×4 IMPLANT
CONNECTOR Y WND VAC (MISCELLANEOUS) IMPLANT
COUNTER SPONGE BAG EZ (MISCELLANEOUS)
COVER WAND RF STERILE (DRAPES) ×3 IMPLANT
DERMABOND ADVANCED (GAUZE/BANDAGES/DRESSINGS) ×2
DERMABOND ADVANCED .7 DNX12 (GAUZE/BANDAGES/DRESSINGS) ×1 IMPLANT
DEVICE PRESTO INFLATION (MISCELLANEOUS) ×4 IMPLANT
DRAPE INCISE IOBAN 66X45 STRL (DRAPES) ×3 IMPLANT
DRAPE ISOLATE BAG 20X20 STRL (DRAPES)
ELECT CAUTERY BLADE 6.4 (BLADE) ×5 IMPLANT
ELECT REM PT RETURN 9FT ADLT (ELECTROSURGICAL) ×3
ELECTRODE REM PT RTRN 9FT ADLT (ELECTROSURGICAL) ×1 IMPLANT
GLIDEWIRE ADV .035X180CM (WIRE) ×2 IMPLANT
GLOVE BIO SURGEON STRL SZ7 (GLOVE) ×14 IMPLANT
GLOVE INDICATOR 7.5 STRL GRN (GLOVE) ×3 IMPLANT
GOWN STRL REUS W/ TWL LRG LVL3 (GOWN DISPOSABLE) ×1 IMPLANT
GOWN STRL REUS W/ TWL XL LVL3 (GOWN DISPOSABLE) ×2 IMPLANT
GOWN STRL REUS W/TWL LRG LVL3 (GOWN DISPOSABLE) ×4
GOWN STRL REUS W/TWL XL LVL3 (GOWN DISPOSABLE) ×4
HEMOSTAT SURGICEL 2X3 (HEMOSTASIS) ×3 IMPLANT
INTRODUCER 7FR 23CM (INTRODUCER) ×4 IMPLANT
IV NS 500ML (IV SOLUTION) ×2
IV NS 500ML BAXH (IV SOLUTION) ×1 IMPLANT
KIT PREVENA INCISION MGT 13 (CANNISTER) ×4 IMPLANT
KIT TURNOVER KIT A (KITS) ×3 IMPLANT
LABEL OR SOLS (LABEL) ×3 IMPLANT
LOOP RED MAXI  1X406MM (MISCELLANEOUS) ×4
LOOP VESSEL MAXI 1X406 RED (MISCELLANEOUS) ×2 IMPLANT
LOOP VESSEL MINI 0.8X406 BLUE (MISCELLANEOUS) ×2 IMPLANT
LOOPS BLUE MINI 0.8X406MM (MISCELLANEOUS) ×6
NDL SAFETY ECLIPSE 18X1.5 (NEEDLE) ×1 IMPLANT
NEEDLE HYPO 18GX1.5 SHARP (NEEDLE) ×2
NS IRRIG 500ML POUR BTL (IV SOLUTION) ×3 IMPLANT
PACK ANGIOGRAPHY (CUSTOM PROCEDURE TRAY) ×2 IMPLANT
PACK BASIN MAJOR ARMC (MISCELLANEOUS) ×3 IMPLANT
PACK UNIVERSAL (MISCELLANEOUS) ×3 IMPLANT
PATCH CAROTID ECM VASC 1X10 (Prosthesis & Implant Heart) ×4 IMPLANT
SHEATH BRITE TIP 5FRX11 (SHEATH) ×4 IMPLANT
STENT LIFESTREAM 8X58X80 (Permanent Stent) ×4 IMPLANT
STENT VIABAHN 7X50X120 (Permanent Stent) ×2 IMPLANT
STENT VIABAHN 7X5X120 7FR (Permanent Stent) IMPLANT
STENT VIABAHN 7X7.5X120 (Permanent Stent) ×2 IMPLANT
SUT MNCRL 4-0 (SUTURE) ×2
SUT MNCRL 4-0 27XMFL (SUTURE) ×1
SUT PROLENE 5 0 RB 1 DA (SUTURE) ×6 IMPLANT
SUT PROLENE 6 0 BV (SUTURE) ×12 IMPLANT
SUT PROLENE 7 0 BV 1 (SUTURE) ×10 IMPLANT
SUT SILK 2 0 (SUTURE) ×2
SUT SILK 2-0 18XBRD TIE 12 (SUTURE) ×1 IMPLANT
SUT SILK 3 0 (SUTURE) ×2
SUT SILK 3-0 18XBRD TIE 12 (SUTURE) ×1 IMPLANT
SUT SILK 4 0 (SUTURE) ×2
SUT SILK 4-0 18XBRD TIE 12 (SUTURE) ×1 IMPLANT
SUT VIC AB 2-0 CT1 27 (SUTURE) ×6
SUT VIC AB 2-0 CT1 TAPERPNT 27 (SUTURE) ×2 IMPLANT
SUT VIC AB 3-0 SH 27 (SUTURE) ×4
SUT VIC AB 3-0 SH 27X BRD (SUTURE) ×1 IMPLANT
SUT VICRYL+ 3-0 36IN CT-1 (SUTURE) ×2 IMPLANT
SUTURE MNCRL 4-0 27XMF (SUTURE) ×1 IMPLANT
SYR 20ML LL LF (SYRINGE) ×3 IMPLANT
SYR 5ML LL (SYRINGE) ×3 IMPLANT
TRAY FOLEY MTR SLVR 16FR STAT (SET/KITS/TRAYS/PACK) ×3 IMPLANT
TUBING CONTRAST HIGH PRESS 72 (TUBING) ×2 IMPLANT
WIRE G 018X200 V18 (WIRE) ×2 IMPLANT
WIRE J 3MM .035X145CM (WIRE) ×2 IMPLANT
WIRE MAGIC TOR.035 180C (WIRE) ×2 IMPLANT
WND VAC CONN Y (MISCELLANEOUS) ×2

## 2019-05-30 NOTE — Op Note (Signed)
OPERATIVE NOTE   PROCEDURE: 1. Left common femoral and profunda femoris endarterectomy with Cormatrix patch angioplasty 2. Right common femoral, superficial femoral and profunda femoris endarterectomy with Cormatrix patch angioplasty 3. Bilateral common iliac artery stents with 8 mm x 58 mm lifestream stents using the kissing balloon technique 4. Bilateral external iliac artery stents with a combination of life star stents and Viabahn stents 5. Introduction catheter into the aorta bilateral common femoral artery approach  PRE-OPERATIVE DIAGNOSIS: Atherosclerotic occlusive disease bilateral lower extremities with lifestyle limiting claudication and mild rest pain symptoms; hypertension; diabetes mellitus  POST-OPERATIVE DIAGNOSIS: Same  CO-SURGEON: Katha Cabal, MD and Algernon Huxley, M.D.  ASSISTANT(S): None  ANESTHESIA: general  ESTIMATED BLOOD LOSS: 300 cc  FINDING(S): 1. Profound calcific plaque noted bilaterally extending past the initial bifurcation of the profunda femoris arteries as well as down the extensive length of the SFA  SPECIMEN(S):  Calcific plaque from the common femoral, superficial femoral and the profunda femoris arteries bilaterally  INDICATIONS:   Courtney Rich 58 y.o. y.o.female who presents with complaints of lifestyle limiting claudication and pain continuously in the feet bilaterally. The patient has documented severe atherosclerotic occlusive disease and has undergone multiple minimally invasive treatments in the past. However, at this point his primary area of stricture stenosis resides in the common femoral and origins of the superficial femoral and profunda femoris extending into these arteries and therefore this is not amenable to intervention and he is now undergoing open endarterectomy. The risks and benefits of been reviewed with the patient, all questions have answered; alternative therapies have been reviewed as well and the patient has agreed  to proceed with surgical open repair.  DESCRIPTION: After obtaining full informed written consent, the patient was brought back to the operating room and placed supine upon the operating table.  The patient received IV antibiotics prior to induction.  After obtaining adequate anesthesia, the patient was prepped and draped in the standard fashion for: bilateral femoral exposure.  Co-surgeons are required because this is a bilateral procedure with work being performed simultaneously from both the right femoral and left femoral approach.  This also expedite the procedure making a shorter operative time reducing complications and improving patient safety.  Attention was turned to the bilateral groin with Dr. Lucky Cowboy working on the left and myself working on the right of the patient.  Vertical  incisions were made over the common femoral artery and dissected down to the common femoral artery with electrocautery.  I dissected out the common femoral artery from the distal external iliac artery (identified by the superficial circumflex vessels) down to the femoral bifurcation.  On initial inspection, the common femoral artery was: Densely calcified and there was no palpable pulse noted bilaterally.    Subsequently the dissection was continued to include all circumflex branches and the profunda femoral artery and superficial femoral artery. The superficial femoral artery was dissected circumferentially for a distance of approximately 3-4 cm and the profunda femoris was dissected circumferentially out to the fourth order branches individual vessel loops were placed around each branch. Both of the groins were treated simultaneously as described above. Control of all branches was obtained with vessel loops.  A softer area in the distal external iliac artery amendable to clamping was identified.    The patient was given 5000 units of Heparin intravenously, which was a therapeutic bolus.     After waiting 3 minutes, access  was obtained to both common femoral arteries using a  Seldinger needle.  J-wire's were then advanced and initially 5 French sheaths were placed.  Wire was then advanced into the aorta under fluoroscopic guidance and a pigtail catheter was used to create an abdominal aortogram with visualization of the common iliac arteries.  Imaging showed greater than 90% stenosis at the origins of the common iliac arteries bilaterally with greater than 70% stenosis of the distal aorta.  There is diffuse greater than 60% stenosis in the common iliacs.  In the left external iliac there is greater than 80% stenosis diffusely.  As noted below the right external iliac although it did not appear to be greater than 60% at previous angiography, at surgery today it was demonstrated the right external iliac artery had significant stenosis.  This disease was so severe it was an impediment to advancing the sheath and angiography demonstrated a greater then 70% diffuse disease  throughout its course.  Appropriate measurements were made and two 8 mm x 58 mm lifestream stents were selected.  The sheaths of both groins were then exchanged from 5 [place] to 7 [place].  It was at this point that it was noted advancing the sheath on the right side was significantly difficult.  This was consistent with high-grade hemodynamically significant lesions.  The 7 French sheaths were then positioned with their tips in the abdominal aorta.  An advantage wire was positioned up the left groin and a Magic torque wire position of the right and then the lifestream stents were advanced so that their leading edges were positioned just below the origin of the IMA.  Both stents were then deployed extending into the common iliacs in a kissing stent fashion.  Inflations were to 12 atm for approximately 30 seconds.  Working initially on the left side the detector was then positioned to the RAO projection the 7 [place] sheath pulled back to just proximal to the ileal  inguinal ligament and hand-injection contrast was used to perform imaging of the external iliac as well as the distal common iliac artery on the left.  We then selected an 8 x 60 life star stent and this was deployed overlapping the lifestream stent extending into the external iliac.  The remaining portion of the external iliac was covered with a Viabahn stent a 7 x 75 mm which was postdilated to 6 mm.  The life star stent was postdilated to 7 mm with a Lutonix drug-eluting balloon.  the distal external iliac artery was clamped and placed all circumflex branches, and the profunda and superficial femoral arteries under tension.  Arteriotomy was made in the common femoral artery with a 11-blade and extended it with a Potts scissor proximally and distally extending the distal end down the SFA for approximately 3 cm.   Endarterectomy was then performed under direct visualization using a freer elevator and a right angle from the mid common femoral extending up both proximally and distally. Proximally the endarterectomy was brought up to the level of the clamp where a clean edge was obtained. Distally the endarterectomy was carried down to a soft spot in the SFA where a feathered edge would was obtained.  7-0 Prolene interrupted tacking sutures were placed to secure the leading edge of the plaque in the SFA.  The profunda femoris was treated with an eversion technique extending endarterectomy approximately 2 cm distally again obtaining a featheredge on both sides right and left.   At this point, I fashioned a core matrix patch for the geometry of the arteriotomy.  The patch  was sewn to the artery with 2 running stitches of 6-0 Prolene, running from each end.  Prior to completing the patch angioplasty, the profunda femoral artery was flushed as was the superficial femoral artery. The system was then forward flushed. The endarterectomy site was then irrigated copiously with heparinized saline. The patch angioplasty  was completed in the usual fashion.  Flow was then reestablished first to the profunda femoris and then the superficial femoral artery. Any gaps or bleeding sites in the suture line were easily controlled with a 6-0 Prolene suture. Doppler is then delivered onto the field and the SFA as well as the profunda femoris arteries were interrogated and found to have triphasic Doppler signals.  Both right and left groins were then irrigated copiously with sterile saline and subsequently Evicel and Surgicel were placed in the wound. The incision was repaired with a double layer of 2-0 Vicryl, a double layer of 3-0 Vicryl, and a layer of 4-0 Monocryl in a subcuticular fashion.  The skin was cleaned, dried, and reinforced with Dermabond.  COMPLICATIONS: None  CONDITION: Courtney Rich, M.D. [facility] Office: [phone]  05/30/2019, 5:41 PM

## 2019-05-30 NOTE — Anesthesia Preprocedure Evaluation (Addendum)
Anesthesia Evaluation  Patient identified by MRN, date of birth, ID band Patient awake    Reviewed: Allergy & Precautions, H&P , NPO status , Patient's Chart, lab work & pertinent test results  History of Anesthesia Complications (+) PONV and history of anesthetic complications  Airway Mallampati: III  TM Distance: >3 FB     Dental  (+) Edentulous Upper, Edentulous Lower   Pulmonary shortness of breath, COPD,  COPD inhaler, Current Smoker and Patient abstained from smoking.,           Cardiovascular hypertension, (-) angina+ CAD, + Past MI, + CABG (7 mos ago) and + Peripheral Vascular Disease  + dysrhythmias (h/o AFib and LBBB) Atrial Fibrillation   02/22/19: SEVERE LV SYSTOLIC DYSFUNCTION, EF [id number] NORMAL RIGHT VENTRICULAR SYSTOLIC FUNCTION NO VALVULAR STENOSIS TRIVIAL MITRAL REGURGITATION MILD TRICUSPID REGURGITATION COMPARED TO PRIOR ECHO 12/11/2018: NO SIGNIFICANT CHANGE  Pulmonary hypertension   Neuro/Psych PSYCHIATRIC DISORDERS Anxiety Depression negative neurological ROS     GI/Hepatic GERD  Controlled,Intrahepatic cholangiocarcinoma, undergoing chemotherapy    Endo/Other  diabetesHyperthyroidism   Renal/GU Renal disease     Musculoskeletal   Abdominal   Peds  Hematology  (+) Blood dyscrasia (Hgb 9.2), anemia ,   Anesthesia Other Findings Glaucoma  Past Medical History: No date: A-fib ([place]) No date: Allergic genetic state No date: Cancer ([place]) No date: Chronic bronchitis with emphysema (HCC) No date: Depression No date: Depression No date: Diabetes mellitus without complication (HCC) No date: Glaucoma (increased eye pressure) No date: History of kidney stones No date: Hyperlipidemia No date: Hypertension No date: Ischemic cardiomyopathy No date: Left bundle branch block (LBBB) No date: Left bundle branch block (LBBB) No date: PONV (postoperative nausea and vomiting) No date: Proliferative  diabetic retinopathy of both eyes (HCC) No date: Proliferative diabetic retinopathy of both eyes ([place])  Past Surgical History: No date: CARDIAC SURGERY No date: EYE SURGERY No date: KIDNEY STONE SURGERY 05/14/2019: LOWER EXTREMITY ANGIOGRAPHY; Right     Comment:  Procedure: LOWER EXTREMITY ANGIOGRAPHY;  Surgeon: Algernon Huxley, MD;  Location: [facility];  Service:               Cardiovascular;  Laterality: Right; No date: TUBAL LIGATION No date: WRIST FRACTURE SURGERY; Left     Reproductive/Obstetrics negative OB ROS                           Anesthesia Physical Anesthesia Plan  ASA: III  Anesthesia Plan: General ETT   Post-op Pain Management:    Induction:   PONV Risk Score and Plan: Ondansetron, Dexamethasone, Treatment may vary due to age or medical condition and Propofol infusion  Airway Management Planned:   Additional Equipment: Arterial line  Intra-op Plan:   Post-operative Plan:   Informed Consent: I have reviewed the patients History and Physical, chart, labs and discussed the procedure including the risks, benefits and alternatives for the proposed anesthesia with the patient or authorized representative who has indicated his/her understanding and acceptance.     Dental Advisory Given  Plan Discussed with: Anesthesiologist, CRNA and Surgeon  Anesthesia Plan Comments:       Anesthesia Quick Evaluation

## 2019-05-30 NOTE — Transfer of Care (Signed)
Immediate Anesthesia Transfer of Care Note  Patient: Courtney Rich  Procedure(s) Performed: ENDARTERECTOMY FEMORAL (Bilateral Leg Upper) INSERTION OF ILIAC STENT (Bilateral Leg Upper)  Patient Location: PACU  Anesthesia Type:General  Level of Consciousness: drowsy and patient cooperative  Airway & Oxygen Therapy: Patient Spontanous Breathing and Patient connected to face mask oxygen  Post-op Assessment: Report given to RN and Post -op Vital signs reviewed and stable  Post vital signs: Reviewed and stable  Last Vitals:  Vitals Value Taken Time  BP 113/53 05/30/19 1750  Temp    Pulse 86 05/30/19 1754  Resp 17 05/30/19 1754  SpO2 98 % 05/30/19 1754  Vitals shown include unvalidated device data.  Last Pain:  Vitals:   05/30/19 1052  TempSrc: Tympanic  PainSc: 0-No pain         Complications: No apparent anesthesia complications

## 2019-05-30 NOTE — Progress Notes (Signed)
Pt received from OR via bed to rm #7. A & O x 4. VS stable, Bilateral femoral endorectomy with dressing to  Wound vac c/d/i. Lt radial A- line unable to calibrate. Foley cather draining clear yellow urine. Will give pain medication for pain & continue to monitor pt closely.[name]

## 2019-05-30 NOTE — H&P (Signed)
[facility] History & Physical Update  The patient was interviewed and re-examined.  The patient's previous History and Physical has been reviewed and is unchanged.  There is no change in the plan of care. We plan to proceed with the scheduled procedure.  Leotis Pain, MD  05/30/2019, 11:04 AM

## 2019-05-30 NOTE — Anesthesia Procedure Notes (Addendum)
Procedure Name: Intubation Date/Time: 05/30/2019 1:36 PM Performed by: Aline Brochure, CRNA Pre-anesthesia Checklist: Patient identified, Emergency Drugs available, Suction available and Patient being monitored Patient Re-evaluated:Patient Re-evaluated prior to induction Oxygen Delivery Method: Circle system utilized Preoxygenation: Pre-oxygenation with 100% oxygen Induction Type: IV induction Ventilation: Mask ventilation without difficulty Laryngoscope Size: McGraph and 3 Grade View: Grade I Tube type: Oral Tube size: 7.0 mm Number of attempts: 1 Airway Equipment and Method: Stylet and Video-laryngoscopy Placement Confirmation: ETT inserted through vocal cords under direct vision,  positive ETCO2 and breath sounds checked- equal and bilateral Secured at: 19 cm Tube secured with: Tape Dental Injury: Teeth and Oropharynx as per pre-operative assessment

## 2019-05-30 NOTE — Op Note (Signed)
OPERATIVE NOTE   PROCEDURE: 1. Left common femoral and profunda femoris endarterectomies 2.   Right common femoral, profunda femoris, and superficial femoral artery endarterectomies 3.   Catheter placement into the aorta from bilateral femoral approaches 4.   Aortogram and iliofemoral arteriogram 5.   Kissing stent placement to bilateral common iliac arteries in the distal aorta using 8 mm diameter by 58 mm length lifestream stents 6.   Additional stent placement to left distal common and proximal external iliac with 8 mm diameter by 6 cm length life star stent and stent placement to the left external iliac artery with a 7 mm diameter by 7.5 cm length Viabahn stent 7.   Additional stent placement to the right distal common and proximal internal iliac with 8 mm diameter by 6 cm length life star stent and stent placement to the right external iliac artery with 7 mm diameter by 5 cm length Viabahn stent   PRE-OPERATIVE DIAGNOSIS: 1.Atherosclerotic occlusive disease bilateral lower extremities with rest pain 2.  Intrahepatic cholangiocarcinoma  POST-OPERATIVE DIAGNOSIS: Same  SURGEON: Leotis Pain, MD  CO-surgeon: Dr. Hortencia Pilar, MD  ANESTHESIA: general  ESTIMATED BLOOD LOSS: 200 cc  FINDING(S): 1. significant plaque in bilateral common femoral, profunda femoris, and superficial femoral arteries 2.  Significant aortoiliac occlusive disease with more severe right external iliac artery disease than was seen on previous imaging  SPECIMEN(S):Right common, profunda, and superficial femoral artery plaque Left common and profunda femoris artery plaque  INDICATIONS:  Patient presents with rest pain to both lower extremities and severe femoral bifurcation disease as well as aortoiliac disease.  Bilateral femoral endarterectomies and aortoiliac intervention are planned to try to improve perfusion. The risks and benefits as well as alternative therapies including intervention were  reviewed in detail all questions were answered the patient agrees to proceed with surgery.  Cosurgeons were used due to the complexity and the bilateral nature of the disease.  DESCRIPTION: After obtaining full informed written consent, the patient was brought back to the operating room and placed supine upon the operating table. The patient received IV antibiotics prior to induction. After obtaining adequate anesthesia, the patient was prepped and draped in the standard fashion appropriate time out is called.   With myself working on the left and Dr. Delana Meyer working on the right we began by dissecting out the femoral arteries on each side. Vertical incisions were created overlying both femoral arteries. The common femoral artery proximally, and superficial femoral artery, and primary profunda femoris artery branches were encircled with vessel loops and prepared for control. Both femoral arteries were found to have significant plaque from the common femoral artery into the profunda and superficial femoral arteries.  5000 units of heparin was given and allowed circulate for 5 minutes.  Additional heparin was given later in the procedure.  We initially began by placing 5 French sheaths in both femoral arteries after access with Seldinger needle with a J-wire placement.  Retrograde imaging performed through both femoral sheaths showed severe iliac disease with greater than 70% stenosis of both external iliac arteries.  This was more severe than appeared present from a left femoral approach on her original aortogram on the right.  It was similar to what we expected on the left.  We then upsized to 7 [place] sheaths and actually on the right there was some resistance with a 7 French sheath due to the severe external iliac artery disease.  The terminal aorta and the proximal common iliac arteries both had bulky  plaque that was in the moderate range on the right and a high-grade range on the left as seen  previously on angiogram. We then proceeded with intervention.  The distal aorta and proximal to mid portions of the common iliac artery were treated with 8 mm diameter by 58 mm length lifestream stents inflated to 12 atm.  The left hypogastric artery was diminutive but the right hypogastric artery was reasonably large.  On both sides, 8 mm diameter by 6 cm length life star stents were deployed from the mid to distal common iliac artery down to the proximal external iliac arteries.  This was done to save the hypogastric arteries.  On the left, after exchanging for a 0.018 wire a 7 mm diameter by 7.5 cm length Viabahn stent was deployed from the proximal external iliac artery down to the distal external iliac artery just above the femoral head.  On the right, also after exchanging for 0.018 wire, a 7 mm diameter by 5 cm length Viabahn stent was deployed from the proximal external iliac artery down to the distal external iliac artery.  These were postdilated with 6 mm balloons in the external iliac artery and a 7 mm Lutonix drug-coated balloons in the noncovered stent portions.  Completion imaging showed the stents to all be widely patent with less than 10% residual stenosis throughout both iliac systems.  We then proceeded with femoral endarterectomy.  Attention is then turned to the right femoral artery. An arteriotomy is made with 11 blade and extended with Potts scissors in the common femoral artery and carried down onto the first 2-3 cm of the superficial femoral artery. An endarterectomy was then performed. The [place] was used to create a plane. The proximal endpoint was cut flush with tenotomy scissors. This was in the proximal common femoral artery.  A large bulky plaque was seen extending well down into the proximal profunda femoris artery as well.  An eversion endarterectomy was then performed for the first 2-3 cm of the profunda femoris artery with the Freer elevator and then gentle traction.  Good backbleeding was then seen from the profunda femoris artery and the endpoint appeared fairly clean. The distal endpoint of the superficial femoral endarterectomy was created with gentle traction and the distal endpoint was clean as well. The Cormatrix patcth is then selected and prepared for a patch angioplasty.  It is cut and beveled and started at the proximal endpoint with a 6-0 Prolene suture.  Approximately one half of the suture line is run medially and laterally and the distal end point was cut and bevelled to match the arteriotomy.  A second 6-0 Prolene was started at the distal end point and run to the mid portion to complete the arteriotomy.  The vessel was flushed prior to release of control and completion of the anastomosis.  At this point, flow was established first to the profunda femoris artery and then to the superficial femoral artery. Easily palpable pulses are noted well beyond the anastomosis and both arteries.  The left femoral artery is then addressed. Arteriotomy is made in the common femoral artery and extended down into the profunda femoris artery about 2 cm. Similarly, an endarterectomy was performed with the [facility]. The proximal endpoint was cut flush with tenotomy scissors and then a remaining plaque was removed with gentle traction in the proximal common femoral artery.  The plaque was pulled from the proximal portion of the superficial femoral artery with gentle traction with a hemostat.  The  arteriotomy was carried down onto the fundal femoris artery and the endarterectomy was continued to this point.  There was a large amount of bulky plaque in the proximal profunda femoris artery that was removed with the endarterectomy.  The distal endpoint was created with gentle traction and was relatively clean. The Cormatrix extracellular patch was then brought onto the field.  It is cut and beveled and started at the proximal endpoint with a 6-0 Prolene suture.  Approximately  one half of the suture line is run medially and laterally and the distal end point was cut and bevelled to match the arteriotomy.  A second 6-0 Prolene was started at the distal end point and run to the mid portion to complete the arteriotomy.   Flushing maneuvers were performed and flow was reestablished to the femoral vessels. Excellent pulses noted in the left superficial femoral and profunda femoris artery below the femoral anastomosis.  Surgicel and Evicel topical hemostatic agents were placed in the femoral incisions and hemostasis was complete. The femoral incisions were then closed in a layered fashion with 2 layers of 2-0 Vicryl, 2 layers of 3-0 Vicryl, and staples and then an incisional VAC for the skin closure.   The patient was then awakened from anesthesia and taken to the recovery room in stable condition having tolerated the procedure well.  COMPLICATIONS: None  CONDITION: Stable     Leotis Pain 05/30/2019 5:33 PM  This note was created with Dragon Medical transcription system. Any errors in dictation are purely unintentional.

## 2019-05-30 NOTE — Anesthesia Procedure Notes (Signed)
Arterial Line Insertion Start/End11/10/2018 1:40 PM, 05/30/2019 2:04 PM Performed by: Durenda Hurt, MD, Aline Brochure, CRNA, CRNA  Preanesthetic checklist: patient identified, IV checked, site marked, risks and benefits discussed, surgical consent, monitors and equipment checked, pre-op evaluation and timeout performed Patient sedated radial was placed Catheter size: 20 G Hand hygiene performed  and Seldinger technique used  Attempts: 2 Procedure performed without using ultrasound guided technique. Following insertion, dressing applied and Biopatch. Post procedure assessment: normal  Patient tolerated the procedure well with no immediate complications.

## 2019-05-30 NOTE — Anesthesia Post-op Follow-up Note (Signed)
Anesthesia QCDR form completed.        

## 2019-05-30 NOTE — OR Nursing (Signed)
First potassium result today was from a tube that only had 44mls of blood. Repeat potassium off of a full tube was 3.8.

## 2019-05-30 NOTE — Anesthesia Postprocedure Evaluation (Signed)
Anesthesia Post Note  Patient: Courtney Rich  Procedure(s) Performed: ENDARTERECTOMY FEMORAL (Bilateral Leg Upper) INSERTION OF ILIAC STENT (Bilateral Leg Upper)  Patient location during evaluation: PACU Anesthesia Type: General Level of consciousness: awake and alert Pain management: pain level controlled Vital Signs Assessment: post-procedure vital signs reviewed and stable Respiratory status: spontaneous breathing, nonlabored ventilation, respiratory function stable and patient connected to nasal cannula oxygen Cardiovascular status: blood pressure returned to baseline and stable Postop Assessment: no apparent nausea or vomiting Anesthetic complications: no     Last Vitals:  Vitals:   05/30/19 1905 05/30/19 1920  BP: 129/62 (!) 141/60  Pulse: 90 91  Resp: 15 19  Temp:    SpO2: 94% 95%    Last Pain:  Vitals:   05/30/19 1920  TempSrc:   PainSc: 5                  [name]

## 2019-05-31 ENCOUNTER — Encounter: Payer: Self-pay | Admitting: Vascular Surgery

## 2019-05-31 LAB — BASIC METABOLIC PANEL
Anion gap: 7 (ref 5–15)
BUN: 7 mg/dL (ref 6–20)
CO2: 23 mmol/L (ref 22–32)
Calcium: 8 mg/dL — ABNORMAL LOW (ref 8.9–10.3)
Chloride: 108 mmol/L (ref 98–111)
Creatinine, Ser: 0.48 mg/dL (ref 0.44–1.00)
GFR calc Af Amer: >60 mL/min (ref >60)
GFR calc non Af Amer: >60 mL/min (ref >60)
Glucose, Bld: 203 mg/dL — ABNORMAL HIGH (ref 70–99)
Potassium: 4.3 mmol/L (ref 3.5–5.1)
Sodium: 138 mmol/L (ref 135–145)

## 2019-05-31 LAB — CBC
HCT: 24 % — ABNORMAL LOW (ref 36.0–46.0)
Hemoglobin: 7.4 g/dL — ABNORMAL LOW (ref 12.0–15.0)
MCH: 30.3 pg (ref 26.0–34.0)
MCHC: 30.8 g/dL (ref 30.0–36.0)
MCV: 98.4 fL (ref 80.0–100.0)
Platelets: 47 K/uL — ABNORMAL LOW (ref 150–400)
RBC: 2.44 MIL/uL — ABNORMAL LOW (ref 3.87–5.11)
RDW: 19.6 % — ABNORMAL HIGH (ref 11.5–15.5)
WBC: 6.4 K/uL (ref 4.0–10.5)
nRBC: 0.5 % — ABNORMAL HIGH (ref 0.0–0.2)

## 2019-05-31 LAB — GLUCOSE, CAPILLARY
Glucose-Capillary: 171 mg/dL — ABNORMAL HIGH (ref 70–99)
Glucose-Capillary: 152 mg/dL — ABNORMAL HIGH (ref 70–99)

## 2019-05-31 LAB — TROPONIN I (HIGH SENSITIVITY)
Troponin I (High Sensitivity): 29 ng/L — ABNORMAL HIGH (ref <18)
Troponin I (High Sensitivity): 31 ng/L — ABNORMAL HIGH (ref <18)
Troponin I (High Sensitivity): 27 ng/L — ABNORMAL HIGH (ref <18)

## 2019-05-31 LAB — MAGNESIUM
Magnesium: 1.7 mg/dL (ref 1.7–2.4)
Magnesium: 1.6 mg/dL — ABNORMAL LOW (ref 1.7–2.4)

## 2019-05-31 LAB — PHOSPHORUS: Phosphorus: 2.8 mg/dL (ref 2.5–4.6)

## 2019-05-31 MED ORDER — GLIPIZIDE 10 MG PO TABS
10.0000 mg | ORAL_TABLET | Freq: Every day | ORAL | Status: DC
  Administered 2019-06-02: 10 mg via ORAL
  Filled 2019-05-31 (×3): qty 1

## 2019-05-31 MED ORDER — KETOROLAC TROMETHAMINE 30 MG/ML IJ SOLN
30.0000 mg | Freq: Four times a day (QID) | INTRAMUSCULAR | Status: AC
  Administered 2019-05-31 – 2019-06-02 (×5): 30 mg via INTRAVENOUS
  Filled 2019-05-31 (×6): qty 1

## 2019-05-31 MED ORDER — CHLORHEXIDINE GLUCONATE CLOTH 2 % EX PADS: 6.0000 | MEDICATED_PAD | Freq: Every day | CUTANEOUS | Status: DC

## 2019-05-31 MED ORDER — MAGNESIUM SULFATE 2 GM/50ML IV SOLN
2.0000 g | Freq: Once | INTRAVENOUS | Status: AC
  Administered 2019-05-31: 2 g via INTRAVENOUS
  Filled 2019-05-31: qty 50

## 2019-05-31 MED ORDER — METFORMIN HCL 500 MG PO TABS
1000.0000 mg | ORAL_TABLET | Freq: Two times a day (BID) | ORAL | Status: DC
  Administered 2019-05-31 – 2019-06-02 (×4): 1000 mg via ORAL
  Filled 2019-05-31 (×5): qty 2

## 2019-05-31 NOTE — Evaluation (Signed)
Physical Therapy Evaluation Patient Details Name: Courtney Rich MRN: [id number] DOB: 1960-11-19 Today's Date: 05/31/2019   History of Present Illness  58yo female pt s/p Bilateral femoral endorectomies and stents placement on 05/31/2019. PMHx includes COPD, HTN, CAD s/p CABG, past MI, PVD, Afib, LBBB, glaucoma, HLD, HTN, diabetic retinopathy, DM, depression, and cancer currently going through treatments.  Clinical Impression  Pt did well with PT exam and surprisingly was able to circumambulate the unit with slow but consistent cadence using the walker.  She was on 3L O2 t/o the effort but sats appeared to stay in the 90s and despite some minimal subjective fatigue ultimately did as much or more than she was typically doing before procedure anyway.  Overall she did well and showed ability to safely return home once medically cleared.  At this point PT is suggesting HHPT and RW, however should she continue to improve as expected she quite possibly will not require either.    Follow Up Recommendations (per progress, likely will not need PT after discharge)    Equipment Recommendations  Rolling walker with 5" wheels(per progress, may not require AD)    Recommendations for Other Services       Precautions / Restrictions Precautions Precautions: Fall Restrictions Weight Bearing Restrictions: No      Mobility  Bed Mobility Overal bed mobility: Modified Independent             General bed mobility comments: deferred, up in recliner  Transfers Overall transfer level: Modified independent Equipment used: Rolling walker (2 wheeled) Transfers: Sit to/from Stand Sit to Stand: Min guard         General transfer comment: Pt needed only minimal reminders to use UEs appropriate, no assist or hesitation  Ambulation/Gait Ambulation/Gait assistance: Min guard Gait Distance (Feet): 175 Feet Assistive device: Rolling walker (2 wheeled)(3L O2)       General Gait Details: Pt able to  walk with consistent and confident cadence, no LOBs, uses of RW was not excessive, on 3L O2 t/o the effort with sats remaining in the 90s and only minimal c/o faitugue/SOB  Stairs            Wheelchair Mobility    Modified Rankin (Stroke Patients Only)       Balance Overall balance assessment: No apparent balance deficits (not formally assessed)                                           Pertinent Vitals/Pain Pain Assessment: 0-10 Pain Score: 7  Pain Location: bilat groin where surgical incisions are Pain Descriptors / Indicators: Aching Pain Intervention(s): Limited activity within patient's tolerance;Monitored during session    Home Living Family/patient expects to be discharged to:: Private residence Living Arrangements: Spouse/significant other Available Help at Discharge: Family;Available 24 hours/day Type of Home: House Home Access: Stairs to enter     Home Layout: One level Home Equipment: Shower seat;Hand held shower head      Prior Function Level of Independence: Independent         Comments: reports she takes the electric cart while shopping, un ad lib in the home     Hand Dominance   Dominant Hand: Right    Extremity/Trunk Assessment   Upper Extremity Assessment Upper Extremity Assessment: Overall WFL for tasks assessed    Lower Extremity Assessment Lower Extremity Assessment: Overall WFL for tasks assessed(only minimal hesistation  with hip flex b/l)       Communication   Communication: No difficulties  Cognition Arousal/Alertness: Awake/alert Behavior During Therapy: WFL for tasks assessed/performed Overall Cognitive Status: Within Functional Limits for tasks assessed                                        General Comments      Exercises Other Exercises Other Exercises: Pt instructed in falls prevention, home/routines modifications, AE/DME for LB ADL to minimize discomfort with ADL 2/2 recent  surgery, and energy conservation strategies including PLB, activity pacing, work simplification; would benefit from a handout   Assessment/Plan    PT Assessment Patient needs continued PT services  PT Problem List Decreased strength;Decreased range of motion;Decreased activity tolerance;Decreased balance;Decreased coordination;Decreased mobility;Decreased cognition;Decreased knowledge of use of DME;Decreased safety awareness;Pain;Cardiopulmonary status limiting activity       PT Treatment Interventions DME instruction;Gait training;Stair training;Therapeutic exercise;Therapeutic activities;Functional mobility training;Balance training;Neuromuscular re-education;Patient/family education    PT Goals (Current goals can be found in the Care Plan section)  Acute Rehab PT Goals Patient Stated Goal: get better and go home PT Goal Formulation: With patient Time For Goal Achievement: 06/14/19 Potential to Achieve Goals: Good    Frequency Min 2X/week   Barriers to discharge        Co-evaluation               AM-PAC PT "6 Clicks" Mobility  Outcome Measure Help needed turning from your back to your side while in a flat bed without using bedrails?: None Help needed moving from lying on your back to sitting on the side of a flat bed without using bedrails?: None Help needed moving to and from a bed to a chair (including a wheelchair)?: None Help needed standing up from a chair using your arms (e.g., wheelchair or bedside chair)?: None Help needed to walk in hospital room?: None Help needed climbing 3-5 steps with a railing? : A Little 6 Click Score: 23    End of Session Equipment Utilized During Treatment: Gait belt;Oxygen(3L) Activity Tolerance: Patient tolerated treatment well;Patient limited by fatigue Patient left: in chair;with call bell/phone within reach Nurse Communication: Mobility status PT Visit Diagnosis: Muscle weakness (generalized) (M62.81);Pain Pain - Right/Left:  Right Pain - part of body: Hip    Time: [id number] PT Time Calculation (min) (ACUTE ONLY): 48 min   Charges:   PT Evaluation $PT Eval Moderate Complexity: 1 Mod PT Treatments $Gait Training: 8-22 mins $Therapeutic Activity: 8-22 mins        Kreg Shropshire, DPT 05/31/2019, 5:48 PM

## 2019-05-31 NOTE — Progress Notes (Signed)
[facility] Daily Progress Note   Subjective: 1 Day Post-Op: 1. Left common femoral and profunda femoris endarterectomies 2.   Right common femoral, profunda femoris, and superficial femoral artery endarterectomies 3.   Catheter placement into the aorta from bilateral femoral approaches 4.   Aortogram and iliofemoral arteriogram 5.   Kissing stent placement to bilateral common iliac arteries in the distal aorta using 8 mm diameter by 58 mm length lifestream stents 6.   Additional stent placement to left distal common and proximal external iliac with 8 mm diameter by 6 cm length life star stent and stent placement to the left external iliac artery with a 7 mm diameter by 7.5 cm length Viabahn stent 7.   Additional stent placement to the right distal common and proximal internal iliac with 8 mm diameter by 6 cm length life star stent and stent placement to the right external iliac artery with 7 mm diameter by 5 cm length Viabahn stent  Patient with some bilateral groin incision discomfort.  Denies any bilateral lower extremity discomfort.  Denies chest pain or shortness of breath.  Objective: Vitals:   05/31/19 0700 05/31/19 0800 05/31/19 0900 05/31/19 1000  BP: 134/64 (!) 138/119 102/60 (!) 104/49  Pulse: 97 (!) 102 90 85  Resp: 18 (!) 21 15 20   Temp:  97.9 F (36.6 C)    TempSrc:  Oral    SpO2: 93% 97% 91% 96%  Weight:      Height:        Intake/Output Summary (Last 24 hours) at 05/31/2019 1111 Last data filed at 05/31/2019 [id number] Gross per 24 hour  Intake 2707.86 ml  Output 2825 ml  Net -117.14 ml   Physical Exam: A&Ox3, NAD CV: RRR Pulmonary: CTA Bilaterally Abdomen: Soft, Nontender, Nondistended Right Groin: Prevena VAC intact - minimal drainage Left Groin: Prevena VAC intact - minimal drainage Vascular:  Right Lower Extremity: Thigh soft.  Calf soft.  Extremities warm distally in toes.  Hard to palpate pedal pulses however the foot is warm with good  capillary refill.  Motor/sensory is intact.  Left Lower Extremity: Thigh soft.  Calf soft.  Extremities warm distally in toes.  Hard to palpate pedal pulses however the foot is warm with good capillary refill.  Motor/sensory is intact.   Laboratory: CBC    Component Value Date/Time   WBC 6.4 05/30/2019 2350   HGB 7.4 (L) 05/30/2019 2350   HCT 24.0 (L) 05/30/2019 2350   PLT 47 (L) 05/30/2019 2350   BMET    Component Value Date/Time   NA 138 05/30/2019 2350   K 4.3 05/30/2019 2350   CL 108 05/30/2019 2350   CO2 23 05/30/2019 2350   GLUCOSE 203 (H) 05/30/2019 2350   BUN 7 05/30/2019 2350   CREATININE 0.48 05/30/2019 2350   CALCIUM 8.0 (L) 05/30/2019 2350   GFRNONAA >60 05/30/2019 2350   GFRAA >60 05/30/2019 2350   Assessment/Planning: The patient is a 58 year old female with multiple medical issues including bilateral lower extremity atherosclerotic disease with rest pain.  She is status post a bilateral femoral endarterectomy - POD#1 1) slightly elevated high sensitive troponin early this AM.  Patient denies any chest pain or shortness of breath.  Vital signs are stable. Next troponin 12pm. 2) remove Foley - TOV 3) PT / OT / Ambulation today - patient refusing PT this AM - had long discussion about its importance 4) Continue Prevena VAC  5) Transfer to surgical floor  Discussed with  Dr. Ellis Parents Zacari Stiff PA-C 05/31/2019 11:11 AM

## 2019-05-31 NOTE — Progress Notes (Signed)
At 00:06 Pt had ST elevation in lead l, ll, lll, V. She's asymptomatic, VS stable. Stat EKG completed, Troponin I, Mag, Phos level drawn. Marcelle Overlie, PA notified. Will continue to monitor pt closely.

## 2019-05-31 NOTE — Evaluation (Signed)
Occupational Therapy Evaluation Patient Details Name: Courtney Rich MRN: [id number] DOB: 1961-01-27 Today's Date: 05/31/2019    History of Present Illness 58yo female pt s/p Bilateral femoral endorectomies and stents placement on 05/31/2019. PMHx includes COPD, HTN, CAD s/p CABG, past MI, PVD, Afib, LBBB, glaucoma, HLD, HTN, diabetic retinopathy, DM, depression, and cancer currently going through treatments.   Clinical Impression   Pt seen for OT evaluation this date. Prior to hospital admission, pt was independent and pt is eager to return to PLOF with less pain. Pt lives with her spouse who can assist as needed. Currently pt demonstrates impairments in bilat groin pain where surgical incisions are and poor activity tolerance which impede pt's ability to perform functional ADL and mobility tasks at baseline independence. Pt currently requires Min-Mod A for LB ADL tasks 2/2 groin pain. Per pt, spouse can provide needed level of assist. Pt instructed in falls prevention, home/routines modifications, AE/DME for LB ADL to minimize discomfort with ADL 2/2 recent surgery, and energy conservation strategies including PLB, activity pacing, work simplification; would benefit from a handout next session. Pt would benefit from skilled OT while in the hospital to address noted impairments and functional limitations (see below for any additional details) in order to maximize safety and independence while minimizing falls risk and caregiver burden. Upon hospital discharge, do not anticipate any skilled OT needs.    Follow Up Recommendations  No OT follow up    Equipment Recommendations  Other (comment);3 in 1 bedside commode(reacher)    Recommendations for Other Services       Precautions / Restrictions Precautions Precautions: Fall Restrictions Weight Bearing Restrictions: No      Mobility Bed Mobility               General bed mobility comments: deferred, up in  recliner  Transfers Overall transfer level: Needs assistance   Transfers: Sit to/from Stand Sit to Stand: Min guard              Balance Overall balance assessment: No apparent balance deficits (not formally assessed)                                         ADL either performed or assessed with clinical judgement   ADL Overall ADL's : Needs assistance/impaired                                       General ADL Comments: Min-Mod A for LB bathing and dressing 2/2 pain with hip movement; spouse able to assist as needed     Vision Baseline Vision/History: Glaucoma;Retinopathy;Wears glasses Wears Glasses: At all times Patient Visual Report: No change from baseline Vision Assessment?: No apparent visual deficits     Perception     Praxis      Pertinent Vitals/Pain Pain Assessment: 0-10 Pain Score: 3  Pain Location: bilat groin where surgical incisions are Pain Descriptors / Indicators: Aching Pain Intervention(s): Limited activity within patient's tolerance;Monitored during session     Hand Dominance Right   Extremity/Trunk Assessment Upper Extremity Assessment Upper Extremity Assessment: Overall WFL for tasks assessed   Lower Extremity Assessment Lower Extremity Assessment: Defer to PT evaluation       Communication Communication Communication: No difficulties   Cognition Arousal/Alertness: Awake/alert Behavior During Therapy: [facility] for tasks assessed/performed  Overall Cognitive Status: Within Functional Limits for tasks assessed                                     General Comments       Exercises Other Exercises Other Exercises: Pt instructed in falls prevention, home/routines modifications, AE/DME for LB ADL to minimize discomfort with ADL 2/2 recent surgery, and energy conservation strategies including PLB, activity pacing, work simplification; would benefit from a handout   Shoulder Instructions       Home Living Family/patient expects to be discharged to:: Private residence Living Arrangements: Spouse/significant other Available Help at Discharge: Family;Available 24 hours/day Type of Home: House       Home Layout: One level     Bathroom Shower/Tub: Teacher, early years/pre: Standard     Home Equipment: Shower seat;Hand held shower head          Prior Functioning/Environment Level of Independence: Independent        Comments: More recently getting SOB/fatigued with minimal effort        OT Problem List: Decreased strength;Pain;Decreased activity tolerance;Decreased knowledge of use of DME or AE      OT Treatment/Interventions: Self-care/ADL training;Therapeutic exercise;Therapeutic activities;Energy conservation;DME and/or AE instruction;Patient/family education    OT Goals(Current goals can be found in the care plan section) Acute Rehab OT Goals Patient Stated Goal: get better and go home OT Goal Formulation: With patient Time For Goal Achievement: 06/14/19 Potential to Achieve Goals: Good ADL Goals Pt Will Perform Lower Body Dressing: with supervision;sit to/from stand;with adaptive equipment Additional ADL Goal #1: Pt will verbalize plan to implement at least 1 learned energy conservation strategy in the home to maximize safety/independence.  OT Frequency: Min 1X/week   Barriers to D/C:            Co-evaluation              AM-PAC OT "6 Clicks" Daily Activity     Outcome Measure Help from another person eating meals?: None Help from another person taking care of personal grooming?: None Help from another person toileting, which includes using toliet, bedpan, or urinal?: A Little Help from another person bathing (including washing, rinsing, drying)?: A Little Help from another person to put on and taking off regular upper body clothing?: None Help from another person to put on and taking off regular lower body clothing?: A Little 6  Click Score: 21   End of Session    Activity Tolerance: Patient tolerated treatment well Patient left: in chair;with call bell/phone within reach  OT Visit Diagnosis: Other abnormalities of gait and mobility (R26.89);Pain Pain - Right/Left: Left Pain - part of body: Hip(bilat groin)                Time: [id number] OT Time Calculation (min): 13 min Charges:  OT General Charges $OT Visit: 1 Visit OT Evaluation $OT Eval Low Complexity: 1 Low  Jeni Salles, MPH, MS, OTR/L ascom [phone] 05/31/19, 5:21 PM

## 2019-05-31 NOTE — Progress Notes (Signed)
PT Cancellation Note  Patient Details Name: Courtney Rich MRN: [id number] DOB: 08/21/1960   Cancelled Treatment:    Reason Eval/Treat Not Completed: Medical issues which prohibited therapy Spoke with nursing about seeing pt, she states that pt is refusing to try walking this AM.  Nursing feels that pt will be ready to work with PT this afternoon.  Kreg Shropshire, DPT 05/31/2019, 10:59 AM

## 2019-05-31 NOTE — Progress Notes (Signed)
A-line and foley removed at this time per order.

## 2019-06-01 ENCOUNTER — Encounter: Payer: Self-pay | Admitting: Vascular Surgery

## 2019-06-01 LAB — BASIC METABOLIC PANEL
Anion gap: 9 (ref 5–15)
BUN: 6 mg/dL (ref 6–20)
CO2: 22 mmol/L (ref 22–32)
Calcium: 7.7 mg/dL — ABNORMAL LOW (ref 8.9–10.3)
Chloride: 103 mmol/L (ref 98–111)
Creatinine, Ser: 0.33 mg/dL — ABNORMAL LOW (ref 0.44–1.00)
GFR calc Af Amer: >60 mL/min (ref >60)
GFR calc non Af Amer: >60 mL/min (ref >60)
Glucose, Bld: 229 mg/dL — ABNORMAL HIGH (ref 70–99)
Potassium: 3.3 mmol/L — ABNORMAL LOW (ref 3.5–5.1)
Sodium: 134 mmol/L — ABNORMAL LOW (ref 135–145)

## 2019-06-01 LAB — GLUCOSE, CAPILLARY
Glucose-Capillary: 364 mg/dL — ABNORMAL HIGH (ref 70–99)
Glucose-Capillary: 227 mg/dL — ABNORMAL HIGH (ref 70–99)
Glucose-Capillary: 304 mg/dL — ABNORMAL HIGH (ref 70–99)
Glucose-Capillary: 227 mg/dL — ABNORMAL HIGH (ref 70–99)
Glucose-Capillary: 257 mg/dL — ABNORMAL HIGH (ref 70–99)
Glucose-Capillary: 155 mg/dL — ABNORMAL HIGH (ref 70–99)

## 2019-06-01 LAB — MAGNESIUM: Magnesium: 1.8 mg/dL (ref 1.7–2.4)

## 2019-06-01 LAB — SURGICAL PATHOLOGY

## 2019-06-01 LAB — CBC
HCT: 19.9 % — ABNORMAL LOW (ref 36.0–46.0)
Hemoglobin: 6.3 g/dL — ABNORMAL LOW (ref 12.0–15.0)
MCH: 30.4 pg (ref 26.0–34.0)
MCHC: 31.7 g/dL (ref 30.0–36.0)
MCV: 96.1 fL (ref 80.0–100.0)
Platelets: 58 10*3/uL — ABNORMAL LOW (ref 150–400)
RBC: 2.07 MIL/uL — ABNORMAL LOW (ref 3.87–5.11)
RDW: 19.4 % — ABNORMAL HIGH (ref 11.5–15.5)
WBC: 4.7 10*3/uL (ref 4.0–10.5)
nRBC: 0 % (ref 0.0–0.2)

## 2019-06-01 LAB — TROPONIN I (HIGH SENSITIVITY): Troponin I (High Sensitivity): 27 ng/L — ABNORMAL HIGH (ref <18)

## 2019-06-01 LAB — PREPARE RBC (CROSSMATCH)

## 2019-06-01 MED ORDER — SODIUM CHLORIDE 0.9% IV SOLUTION
Freq: Once | INTRAVENOUS | Status: AC
  Administered 2019-06-01: 14:00:00 via INTRAVENOUS

## 2019-06-01 MED ORDER — MAGNESIUM SULFATE 2 GM/50ML IV SOLN
2.0000 g | Freq: Once | INTRAVENOUS | Status: AC
  Administered 2019-06-01: 2 g via INTRAVENOUS
  Filled 2019-06-01: qty 50

## 2019-06-01 MED ORDER — FUROSEMIDE 10 MG/ML IJ SOLN
20.0000 mg | Freq: Once | INTRAMUSCULAR | Status: AC
  Administered 2019-06-01: 20 mg via INTRAVENOUS
  Filled 2019-06-01: qty 2

## 2019-06-01 MED ORDER — SERTRALINE HCL 50 MG PO TABS
50.0000 mg | ORAL_TABLET | Freq: Every day | ORAL | Status: DC
  Administered 2019-06-01: 50 mg via ORAL
  Filled 2019-06-01: qty 1

## 2019-06-01 MED ORDER — POTASSIUM CHLORIDE CRYS ER 20 MEQ PO TBCR
40.0000 meq | EXTENDED_RELEASE_TABLET | Freq: Once | ORAL | Status: AC
  Administered 2019-06-01: 40 meq via ORAL
  Filled 2019-06-01: qty 2

## 2019-06-01 MED ORDER — DIPHENHYDRAMINE HCL 50 MG/ML IJ SOLN
25.0000 mg | Freq: Once | INTRAMUSCULAR | Status: AC
  Administered 2019-06-01: 25 mg via INTRAVENOUS
  Filled 2019-06-01: qty 1

## 2019-06-01 MED ORDER — PRAVASTATIN SODIUM 20 MG PO TABS
40.0000 mg | ORAL_TABLET | Freq: Every day | ORAL | Status: DC
  Administered 2019-06-01: 40 mg via ORAL
  Filled 2019-06-01: qty 2

## 2019-06-01 MED ORDER — ACETAMINOPHEN 325 MG PO TABS
650.0000 mg | ORAL_TABLET | Freq: Once | ORAL | Status: AC
  Administered 2019-06-01: 650 mg via ORAL
  Filled 2019-06-01: qty 2

## 2019-06-01 MED ORDER — ALPRAZOLAM 0.5 MG PO TABS
0.5000 mg | ORAL_TABLET | Freq: Three times a day (TID) | ORAL | Status: DC | PRN
  Administered 2019-06-01 – 2019-06-02 (×2): 0.5 mg via ORAL
  Filled 2019-06-01 (×2): qty 1

## 2019-06-01 MED ORDER — SODIUM CHLORIDE 1 G PO TABS
2.0000 g | ORAL_TABLET | Freq: Once | ORAL | Status: AC
  Administered 2019-06-01: 2 g via ORAL
  Filled 2019-06-01: qty 2

## 2019-06-01 NOTE — Discharge Instructions (Signed)
Vascular Surgery Discharge Instructions 1) You may shower as of Saturday. Keep incisions clean and dry. 2) No heavy lifting >10 pound or strenuous activity until cleared at your first post-op appointment 3) No driving while on pain medication.

## 2019-06-01 NOTE — Progress Notes (Signed)
[facility]  Daily Progress Note   Subjective  -   Feels pretty good this morning soreness in the groins.  Says she walked around the ICU last night and did okay.  Mildly tachycardic and hemoglobin was found to be low this morning.  Objective Vitals:   06/01/19 0320 06/01/19 0400 06/01/19 0500 06/01/19 0600  BP:  (!) 108/49 125/65 (!) 125/58  Pulse: (!) 105 (!) 103 (!) 106 96  Resp: (!) 24 (!) 21 (!) 21 (!) 22  Temp:      TempSrc:      SpO2: 96% 96% 99% 97%  Weight:      Height:        Intake/Output Summary (Last 24 hours) at 06/01/2019 0857 Last data filed at 06/01/2019 0500 Gross per 24 hour  Intake 249.35 ml  Output 251 ml  Net -1.65 ml    PULM  persistent cough is present CV  slightly tachycardic but regular VASC  palpable pedal pulses  Laboratory CBC    Component Value Date/Time   WBC 4.7 06/01/2019 0521   HGB 6.3 (L) 06/01/2019 0521   HCT 19.9 (L) 06/01/2019 0521   PLT 58 (L) 06/01/2019 0521    BMET    Component Value Date/Time   NA 134 (L) 06/01/2019 0521   K 3.3 (L) 06/01/2019 0521   CL 103 06/01/2019 0521   CO2 22 06/01/2019 0521   GLUCOSE 229 (H) 06/01/2019 0521   BUN 6 06/01/2019 0521   CREATININE 0.33 (L) 06/01/2019 0521   CALCIUM 7.7 (L) 06/01/2019 0521   GFRNONAA >60 06/01/2019 0521   GFRAA >60 06/01/2019 0521    Assessment/Planning: POD #2 s/p bilateral femoral endarterectomies and extensive bilateral iliac stent placement Overall doing fairly well    Hemoglobin is low today.  This is likely a combination of chronic anemia with her ongoing chemotherapy and cancer treatment as well as some blood loss with surgery.  No active bleeding.  We are going to transfuse at least 1 unit of packed red blood cells today.  Patient is ambulating.  She did work with PT today to assess for home safety.  Patient desires to go home and if she is doing well after her blood transfusion this afternoon or evening, discharge is possible  later today or tomorrow    Leotis Pain  06/01/2019, 8:57 AM

## 2019-06-01 NOTE — Progress Notes (Signed)
Patient refused bath as well as linen/gown change.

## 2019-06-01 NOTE — TOC Initial Note (Signed)
Transition of Care [facility]) - Initial/Assessment Note    Patient Details  Name: Courtney Rich MRN: [id number] Date of Birth: 03-Aug-1960  [facility] CM/SW Contact:    Annamaria Boots, [place] Phone Number: 06/01/2019, 2:27 PM  Clinical Narrative:    Patient will discharge home today. PT and OT recommended patient have rolling walker and 3-in-1. CSW contacted Brad with [employer] for equipment. Equipment has been delivered. Patient will be trained on wound dressing changes by RN. Patient should DC home tomorrow with husband               Expected Discharge Plan: Home/Self Care Barriers to Discharge: No Barriers Identified   Patient Goals and CMS Choice Patient states their goals for this hospitalization and ongoing recovery are:: return home CMS Medicare.gov Compare Post Acute Care list provided to:: Patient Choice offered to / list presented to : Patient  Expected Discharge Plan and Services Expected Discharge Plan: Home/Self Care       Living arrangements for the past 2 months: Single Family Home                 DME Arranged: 3-N-1, Walker rolling DME Agency: [employer] Date DME Agency Contacted: 06/01/19   Representative spoke with at DME Agency: Leroy Sea            Prior Living Arrangements/Services Living arrangements for the past 2 months: [place] Lives with:: Spouse Patient language and need for interpreter reviewed:: Yes Do you feel safe going back to the place where you live?: Yes      Need for Family Participation in Patient Care: Yes (Comment) Care giver support system in place?: Yes (comment)   Criminal Activity/Legal Involvement Pertinent to Current Situation/Hospitalization: No - Comment as needed  Activities of Daily Living      Permission Sought/Granted Permission sought to share information with : Case Manager, Customer service manager, Family Supports Permission granted to share information with : Yes, Verbal Permission  Granted              Emotional Assessment Appearance:: Appears stated age Attitude/Demeanor/Rapport: Engaged Affect (typically observed): Accepting, Hopeful Orientation: : Oriented to Self, Oriented to Place, Oriented to  Time, Oriented to Situation Alcohol / Substance Use: Not Applicable Psych Involvement: No (comment)  Admission diagnosis:  ASO WITH CLAUDICATION Patient Active Problem List   Diagnosis Date Noted  . PAD (peripheral artery disease) ([place]) 05/30/2019  . Glaucoma (increased eye pressure) 04/24/2019  . Tobacco dependence 04/24/2019  . Atherosclerosis of native arteries of extremity with intermittent claudication ([place]) 04/24/2019  . Multiple thyroid nodules 02/21/2019  . On amiodarone therapy 02/21/2019  . Subclinical hyperthyroidism 02/21/2019  . Intrahepatic cholangiocarcinoma ([place]) 01/31/2019  . Ileus ([place]) 11/06/2018  . Postoperative atrial fibrillation ([place]) 11/05/2018  . Acute postoperative pain 11/02/2018  . S/P CABG x 3 11/02/2018  . Claudication of lower extremity ([place]) 10/27/2018  . CAD, multiple vessel 10/16/2018  . Liver mass 10/16/2018  . Positive anti-CCP test 05/22/2018  . ESR raised 05/08/2018  . Lung nodule seen on imaging study 01/12/2018  . Pulmonary hypertension ([place]) 11/30/2017  . Ischemic cardiomyopathy 07/28/2017  . LBBB (left bundle branch block) 07/28/2017  . Chronic bronchitis with emphysema ([place]) 05/13/2017  . Mild concentric left ventricular hypertrophy (LVH) 02/09/2017  . Diabetes mellitus type 2, uncomplicated ([place]) 88/91/6945  . Vitamin D deficiency 08/14/2014  . Allergic rhinitis 07/12/2014  . Anxiety and depression 07/12/2014  . Essential hypertension 07/12/2014   PCP:  Zeb Comfort, MD Pharmacy:   [id number] [id number]: [phone]: [phone]    Social Determinants of Health (SDOH) Interventions    Readmission Risk Interventions No  flowsheet data found.

## 2019-06-01 NOTE — Progress Notes (Signed)
PT Cancellation Note  Patient Details Name: Courtney Rich MRN: [id number] DOB: 1960/08/09   Cancelled Treatment:    Reason Eval/Treat Not Completed: Medical issues which prohibited therapy Pt with low H/H, getting blood transfusion this afternoon.  Will likely d/c tomorrow per medical status.    Kreg Shropshire, DPT 06/01/2019, 4:57 PM

## 2019-06-01 NOTE — Progress Notes (Signed)
Occupational Therapy Treatment Patient Details Name: Courtney Rich MRN: [id number] DOB: 09/18/1960 Today's Date: 06/01/2019    History of present illness 58yo female pt s/p Bilateral femoral endorectomies and stents placement on 05/31/2019. PMHx includes COPD, HTN, CAD s/p CABG, past MI, PVD, Afib, LBBB, glaucoma, HLD, HTN, diabetic retinopathy, DM, depression, and cancer currently going through treatments.   OT comments  Pt seen for OT tx this date. Pt instructed in energy conservation strategies including activity pacing, work simplification, AE/DME, rest breaks, prioritizing of meaningful occupations, and falls prevention; handout provided to support recall and carryover. Pt verbalized understanding. No additional skilled OT needs at this time. Will discharge in house.    Follow Up Recommendations  No OT follow up    Equipment Recommendations  Other (comment);3 in 1 bedside commode(reacher, LH sponge, LH shoe horn)    Recommendations for Other Services      Precautions / Restrictions Precautions Precautions: Fall Restrictions Weight Bearing Restrictions: No       Mobility Bed Mobility               General bed mobility comments: deferred, up in recliner  Transfers                 General transfer comment: held, pt pending blood transfusion, Hgb 6.3    Balance                                           ADL either performed or assessed with clinical judgement   ADL                                               Vision       Perception     Praxis      Cognition Arousal/Alertness: Awake/alert Behavior During Therapy: WFL for tasks assessed/performed Overall Cognitive Status: Within Functional Limits for tasks assessed                                          Exercises Other Exercises Other Exercises: Pt instructed in energy conservation strategies including activity pacing, work  simplification, AE/DME, rest breaks, prioritizing of meaningful occupations, and falls prevention; handout provided to support recall and carryover   Shoulder Instructions       General Comments      Pertinent Vitals/ Pain       Pain Assessment: Faces Faces Pain Scale: Hurts a little bit Pain Location: bilat groin where surgical incisions are Pain Descriptors / Indicators: Aching Pain Intervention(s): Limited activity within patient's tolerance;Monitored during session  Home Living                                          Prior Functioning/Environment              Frequency  Min 1X/week        Progress Toward Goals  OT Goals(current goals can now be found in the care plan section)  Progress towards OT goals: Goals met/education completed, patient discharged from OT  Acute Rehab OT  Goals Patient Stated Goal: get better and go home OT Goal Formulation: With patient Time For Goal Achievement: 06/14/19 Potential to Achieve Goals: Good  Plan All goals met and education completed, patient discharged from OT services    Co-evaluation                 AM-PAC OT "6 Clicks" Daily Activity     Outcome Measure   Help from another person eating meals?: None Help from another person taking care of personal grooming?: None Help from another person toileting, which includes using toliet, bedpan, or urinal?: A Little Help from another person bathing (including washing, rinsing, drying)?: A Little Help from another person to put on and taking off regular upper body clothing?: None Help from another person to put on and taking off regular lower body clothing?: A Little 6 Click Score: 21    End of Session    OT Visit Diagnosis: Other abnormalities of gait and mobility (R26.89);Pain Pain - Right/Left: Left Pain - part of body: Hip   Activity Tolerance Patient tolerated treatment well   Patient Left in chair;with call bell/phone within reach    Nurse Communication          Time: 0370-9643 OT Time Calculation (min): 12 min  Charges: OT General Charges $OT Visit: 1 Visit OT Treatments $Self Care/Home Management : 8-22 mins  Jeni Salles, MPH, MS, OTR/L ascom [phone] 06/01/19, 9:58 AM

## 2019-06-02 LAB — TYPE AND SCREEN
ABO/RH(D): A POS
Antibody Screen: NEGATIVE
Unit division: 0
Unit division: 0

## 2019-06-02 LAB — BPAM RBC
Blood Product Expiration Date: 202012022359
Blood Product Expiration Date: 202012022359
ISSUE DATE / TIME: 202011061422
ISSUE DATE / TIME: 202011061835
Unit Type and Rh: 6200
Unit Type and Rh: 6200

## 2019-06-02 LAB — BASIC METABOLIC PANEL
Anion gap: 9 (ref 5–15)
BUN: 10 mg/dL (ref 6–20)
CO2: 22 mmol/L (ref 22–32)
Calcium: 8 mg/dL — ABNORMAL LOW (ref 8.9–10.3)
Chloride: 104 mmol/L (ref 98–111)
Creatinine, Ser: 0.48 mg/dL (ref 0.44–1.00)
GFR calc Af Amer: >60 mL/min (ref >60)
GFR calc non Af Amer: >60 mL/min (ref >60)
Glucose, Bld: 311 mg/dL — ABNORMAL HIGH (ref 70–99)
Potassium: 4 mmol/L (ref 3.5–5.1)
Sodium: 135 mmol/L (ref 135–145)

## 2019-06-02 LAB — GLUCOSE, CAPILLARY
Glucose-Capillary: 287 mg/dL — ABNORMAL HIGH (ref 70–99)
Glucose-Capillary: 156 mg/dL — ABNORMAL HIGH (ref 70–99)

## 2019-06-02 LAB — CBC
HCT: 30.2 % — ABNORMAL LOW (ref 36.0–46.0)
Hemoglobin: 9.7 g/dL — ABNORMAL LOW (ref 12.0–15.0)
MCH: 29.2 pg (ref 26.0–34.0)
MCHC: 32.1 g/dL (ref 30.0–36.0)
MCV: 91 fL (ref 80.0–100.0)
Platelets: 71 10*3/uL — ABNORMAL LOW (ref 150–400)
RBC: 3.32 MIL/uL — ABNORMAL LOW (ref 3.87–5.11)
RDW: 19.9 % — ABNORMAL HIGH (ref 11.5–15.5)
WBC: 5.6 10*3/uL (ref 4.0–10.5)
nRBC: 0 % (ref 0.0–0.2)

## 2019-06-02 LAB — MAGNESIUM: Magnesium: 2 mg/dL (ref 1.7–2.4)

## 2019-06-02 LAB — HEMOGLOBIN: Hemoglobin: 8.9 g/dL — ABNORMAL LOW (ref 12.0–15.0)

## 2019-06-02 MED ORDER — CLOPIDOGREL BISULFATE 75 MG PO TABS: 75.0000 mg | ORAL_TABLET | Freq: Every day | ORAL | 5 refills | Status: AC

## 2019-06-02 MED ORDER — OXYCODONE HCL 5 MG PO TABS: 5.0000 mg | ORAL_TABLET | ORAL | 0 refills | Status: AC | PRN

## 2019-06-02 MED ORDER — ASPIRIN 81 MG PO TBEC: 81.0000 mg | DELAYED_RELEASE_TABLET | Freq: Every day | ORAL | 1 refills | Status: AC

## 2019-06-02 NOTE — Progress Notes (Signed)
Patient refused bath, but did allow bed linens to be changed.

## 2019-06-02 NOTE — Progress Notes (Signed)
Patient port was de-accessed by our charge nurse.  Patient did not have any questions about her medications. RN gave her the discharge paperwork and went over her instructions with patient and her husband.  Patient did not have any IVs.  Patient left by private vehicle with her husband.  Phillis Knack, RN

## 2019-06-02 NOTE — Discharge Summary (Signed)
Physician Discharge Summary  Patient ID: Courtney Rich MRN: [id number] DOB/AGE: [birth year] 58 y.o.  Admit date: 05/30/2019 Discharge date: 06/02/2019 Admission Diagnoses:  Discharge Diagnoses:  Active Problems:   PAD (peripheral artery disease) ([place])   Discharged Condition: good  Hospital Course: status post bilateral femoral endareterectomies and iliac stenting.  Kept in ICU.  She did reveive blood transfusion post op.  Consults: None    Discharge Exam: Blood pressure 130/78, pulse 86, temperature 98 F (36.7 C), temperature source Oral, resp. rate 20, height 5\' 1"  (1.549 m), weight 53.6 kg, SpO2 100 %. Incision/Wound:provena vacs inplace  Disposition:    Allergies as of 06/02/2019      Reactions   Dapagliflozin Nausea Only   Empagliflozin Nausea Only   Lisinopril    Muscle Pain   Other    Sulfa Antibiotics Swelling   Hives      Medication List    TAKE these medications   albuterol 108 (90 Base) MCG/ACT inhaler Commonly known as: VENTOLIN HFA Inhale 2 puffs into the lungs every 4 (four) hours as needed for wheezing or shortness of breath.   aspirin 81 MG EC tablet Take 1 tablet (81 mg total) by mouth daily at 6 (six) AM. Start taking on: June 03, 2019 What changed: when to take this   brimonidine 0.2 % ophthalmic solution Commonly known as: ALPHAGAN Place into the right eye 2 (two) times daily.   carvedilol 3.125 MG tablet Commonly known as: COREG Take 3.125 mg by mouth 2 (two) times daily with a meal.   cetirizine 10 MG tablet Commonly known as: ZYRTEC Take 10 mg by mouth daily.   cholecalciferol 1000 units tablet Commonly known as: VITAMIN D Take 1,000 Units by mouth daily.   clopidogrel 75 MG tablet Commonly known as: Plavix Take 1 tablet (75 mg total) by mouth daily. What changed: Another medication with the same name was added. Make sure you understand how and when to take each.   clopidogrel 75 MG tablet Commonly known as: PLAVIX Take  1 tablet (75 mg total) by mouth daily at 6 (six) AM. Start taking on: June 03, 2019 What changed: You were already taking a medication with the same name, and this prescription was added. Make sure you understand how and when to take each.   glipiZIDE 10 MG tablet Commonly known as: GLUCOTROL Take 10 mg by mouth daily before breakfast.   isosorbide mononitrate 30 MG 24 hr tablet Commonly known as: IMDUR Take 30 mg by mouth daily.   ketoconazole 2 % cream Commonly known as: NIZORAL Apply 1 application topically 2 (two) times daily.   losartan 25 MG tablet Commonly known as: COZAAR Take 25 mg by mouth daily.   metFORMIN 500 MG tablet Commonly known as: GLUCOPHAGE Take 1,000 mg by mouth 2 (two) times daily with a meal.   methimazole 5 MG tablet Commonly known as: TAPAZOLE Take 5 mg by mouth daily.   oxyCODONE 5 MG immediate release tablet Commonly known as: Oxy IR/ROXICODONE Take 1-2 tablets (5-10 mg total) by mouth every 4 (four) hours as needed for moderate pain.   pravastatin 40 MG tablet Commonly known as: PRAVACHOL Take 40 mg by mouth daily.   sertraline 50 MG tablet Commonly known as: ZOLOFT Take 50 mg by mouth daily.   timolol 0.25 % ophthalmic solution Commonly known as: TIMOPTIC Place 1 drop into the right eye 2 (two) times daily.   tiotropium 18 MCG inhalation capsule Commonly known as: [place]  18 mcg into inhaler and inhale daily.            Durable Medical Equipment  (From admission, onward)         Start     Ordered   06/01/19 1235  DME Walker rolling  Once    Question:  Patient needs a walker to treat with the following condition  Answer:  PAD (peripheral artery disease) ([place])   06/01/19 1234   06/01/19 1106  For home use only DME 3 n 1  Once     06/01/19 1109         Follow-up Information    Kris Hartmann, NP Follow up in 1 week(s).   Specialty: Vascular Surgery Why: First post-op check. Incision check. No studies needed.   Contact information: [place] [address] [phone]           Signed: Wells Brabham 06/02/2019, 12:39 PM

## 2019-06-02 NOTE — Progress Notes (Signed)
    Subjective  - POD #3, s/p B Fem endarterectomies and iliac stenting  Wants to go home   Physical Exam:  Groins soft Provena with good seal       Assessment/Plan:  POD #2  Provena converted to home device Excellent response to blood yesterday [place] 06/02/2019 12:27 PM --  Vitals:   06/02/19 1000 06/02/19 1100  BP:    Pulse:    Resp: 20 20  Temp:    SpO2:      Intake/Output Summary (Last 24 hours) at 06/02/2019 1227 Last data filed at 06/02/2019 1100 Gross per 24 hour  Intake 440 ml  Output 600 ml  Net -160 ml     Laboratory CBC    Component Value Date/Time   WBC 5.6 06/02/2019 0500   HGB 9.7 (L) 06/02/2019 0500   HCT 30.2 (L) 06/02/2019 0500   PLT 71 (L) 06/02/2019 0500    BMET    Component Value Date/Time   NA 135 06/02/2019 0500   K 4.0 06/02/2019 0500   CL 104 06/02/2019 0500   CO2 22 06/02/2019 0500   GLUCOSE 311 (H) 06/02/2019 0500   BUN 10 06/02/2019 0500   CREATININE 0.48 06/02/2019 0500   CALCIUM 8.0 (L) 06/02/2019 0500   GFRNONAA >60 06/02/2019 0500   GFRAA >60 06/02/2019 0500    COAG Lab Results  Component Value Date   INR 0.9 05/22/2019   No results found for: PTT  Antibiotics Anti-infectives (From admission, onward)   Start     Dose/Rate Route Frequency Ordered Stop   05/30/19 2200  ceFAZolin (ANCEF) IVPB 2g/100 mL premix     2 g 200 mL/hr over 30 Minutes Intravenous Every 8 hours 05/30/19 1604 05/31/19 0546   05/30/19 1153  ceFAZolin (ANCEF) 2-4 GM/100ML-% IVPB    Note to Pharmacy: [facility] Blizzard  : cabinet override      05/30/19 1153 05/30/19 1423   05/30/19 0600  ceFAZolin (ANCEF) IVPB 2g/100 mL premix     2 g 200 mL/hr over 30 Minutes Intravenous On call to O.R. 05/30/19 0131 05/30/19 1433       V. Leia Alf, M.D., [facility] [place] Office: [phone] Pager:  [phone]

## 2019-06-04 ENCOUNTER — Encounter: Payer: Self-pay | Admitting: Vascular Surgery

## 2019-06-04 DIAGNOSIS — E1165 Type 2 diabetes mellitus with hyperglycemia: Secondary | ICD-10-CM | POA: Insufficient documentation

## 2019-06-04 DIAGNOSIS — I2510 Atherosclerotic heart disease of native coronary artery without angina pectoris: Secondary | ICD-10-CM | POA: Insufficient documentation

## 2019-06-05 NOTE — H&P (Signed)
es Courtney Rich Admission History & Physical  MRN : [id number]  Courtney Rich is a 58 y.o. (05-30-1961) female who presents with chief complaint of No chief complaint on file. Courtney Rich  History of Present Illness: Patient presents today for treatment of her severe peripheral arterial disease.  She has severe aortoiliac disease and bilateral common femoral artery and femoral bifurcation disease that we are planning to treat with femoral endarterectomy and iliac stent placement.  She has undergone infrainguinal revascularization on the right leg.  This has improved her right leg symptoms somewhat, but she continues to be symptomatic.  The left leg is now the more severely affected the 2 legs.  No new ulcerations or infections.  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  No current facility-administered medications for this encounter.    Current Outpatient Medications  Medication Sig Dispense Refill  . albuterol (PROVENTIL HFA;VENTOLIN HFA) 108 (90 Base) MCG/ACT inhaler Inhale 2 puffs into the lungs every 4 (four) hours as needed for wheezing or shortness of breath.    . carvedilol (COREG) 3.125 MG tablet Take 3.125 mg by mouth 2 (two) times daily with a meal.    . cetirizine (ZYRTEC) 10 MG tablet Take 10 mg by mouth daily.    . cholecalciferol (VITAMIN D) 1000 units tablet Take 1,000 Units by mouth daily.    Courtney Rich glipiZIDE (GLUCOTROL) 10 MG tablet Take 10 mg by mouth daily before breakfast.    . isosorbide mononitrate (IMDUR) 30 MG 24 hr tablet Take 30 mg by mouth daily.    . methimazole (TAPAZOLE) 5 MG tablet Take 5 mg by mouth daily.     Courtney Rich tiotropium (SPIRIVA) 18 MCG inhalation capsule Place 18 mcg into inhaler and inhale daily.    Courtney Rich aspirin EC 81 MG EC tablet Take 1 tablet (81 mg total) by mouth daily at 6 (six) AM. 1 tablet 1  . brimonidine (ALPHAGAN) 0.2 % ophthalmic solution Place into the right eye 2 (two) times daily.    . clopidogrel  (PLAVIX) 75 MG tablet Take 1 tablet (75 mg total) by mouth daily. 30 tablet 11  . clopidogrel (PLAVIX) 75 MG tablet Take 1 tablet (75 mg total) by mouth daily at 6 (six) AM. 1 tablet 5  . ketoconazole (NIZORAL) 2 % cream Apply 1 application topically 2 (two) times daily.    Courtney Rich losartan (COZAAR) 25 MG tablet Take 25 mg by mouth daily.    . metFORMIN (GLUCOPHAGE) 500 MG tablet Take 1,000 mg by mouth 2 (two) times daily with a meal.     . oxyCODONE (OXY IR/ROXICODONE) 5 MG immediate release tablet Take 1-2 tablets (5-10 mg total) by mouth every 4 (four) hours as needed for moderate pain. 30 tablet 0  . pravastatin (PRAVACHOL) 40 MG tablet Take 40 mg by mouth daily.    . sertraline (ZOLOFT) 50 MG tablet Take 50 mg by mouth daily.    . timolol (TIMOPTIC) 0.25 % ophthalmic solution Place 1 drop into the right eye 2 (two) times daily.      Past Medical History:  Diagnosis Date  . A-fib ([place])   . Allergic genetic state   . Cancer ([place]) 10/2018   liver   . Chronic bronchitis with emphysema ([place])   . Depression   . Depression   . Diabetes mellitus without complication ([place])   . Glaucoma (increased eye pressure)   . History of kidney stones   . Hyperlipidemia   .  Hypertension   . Ischemic cardiomyopathy   . Left bundle branch block (LBBB)   . Left bundle branch block (LBBB)   . PONV (postoperative nausea and vomiting)   . Proliferative diabetic retinopathy of both eyes ([place])   . Proliferative diabetic retinopathy of both eyes [facility])     Past Surgical History:  Procedure Laterality Date  . CARDIAC SURGERY    . CORONARY ARTERY BYPASS GRAFT     triple bypass   . ENDARTERECTOMY FEMORAL Bilateral 05/30/2019   Procedure: ENDARTERECTOMY FEMORAL;  Surgeon: Algernon Huxley, MD;  Location: [facility] ORS;  Service: Vascular;  Laterality: Bilateral;  . EYE SURGERY    . INSERTION OF ILIAC STENT Bilateral 05/30/2019   Procedure: INSERTION OF ILIAC STENT;  Surgeon: Algernon Huxley, MD;  Location: [facility] ORS;   Service: Vascular;  Laterality: Bilateral;  . KIDNEY STONE SURGERY    . LOWER EXTREMITY ANGIOGRAPHY Right 05/14/2019   Procedure: LOWER EXTREMITY ANGIOGRAPHY;  Surgeon: Algernon Huxley, MD;  Location: [place] CV LAB;  Service: Cardiovascular;  Laterality: Right;  . TUBAL LIGATION    . WRIST FRACTURE SURGERY Left      Social History   Tobacco Use  . Smoking status: Current Every Day Smoker    Years: 42.00  . Smokeless tobacco: Never Used  Substance Use Topics  . Alcohol use: No  . Drug use: Never     Family History  Problem Relation Age of Onset  . Breast cancer Neg Hx   No bleeding disorders, clotting disorders, or aneurysms  Allergies  Allergen Reactions  . Dapagliflozin Nausea Only  . Empagliflozin Nausea Only  . Lisinopril     Muscle Pain  . Other   . Sulfa Antibiotics Swelling    Hives    REVIEW OF SYSTEMS (Negative unless checked)  Constitutional: [] ?Weight loss  [] ?Fever  [] ?Chills Cardiac: [] ?Chest pain   [] ?Chest pressure   [] ?Palpitations   [] ?Shortness of breath when laying flat   [] ?Shortness of breath at rest   [] ?Shortness of breath with exertion. Vascular:  [x] ?Pain in legs with walking   [] ?Pain in legs at rest   [] ?Pain in legs when laying flat   [x] ?Claudication   [] ?Pain in feet when walking  [] ?Pain in feet at rest  [] ?Pain in feet when laying flat   [] ?History of DVT   [] ?Phlebitis   [] ?Swelling in legs   [] ?Varicose veins   [] ?Non-healing ulcers Pulmonary:   [] ?Uses home oxygen   [] ?Productive cough   [] ?Hemoptysis   [] ?Wheeze  [] ?COPD   [] ?Asthma Neurologic:  [] ?Dizziness  [] ?Blackouts   [] ?Seizures   [] ?History of stroke   [] ?History of TIA  [] ?Aphasia   [] ?Temporary blindness   [] ?Dysphagia   [] ?Weakness or numbness in arms   [x] ?Weakness or numbness in legs Musculoskeletal:  [x] ?Arthritis   [] ?Joint swelling   [] ?Joint pain   [] ?Low back pain Hematologic:  [] ?Easy bruising  [] ?Easy bleeding   [] ?Hypercoagulable state   [] ?Anemic   [] ?Hepatitis Gastrointestinal:  [] ?Blood in stool   [] ?Vomiting blood  [] ?Gastroesophageal reflux/heartburn   [] ?Abdominal pain Genitourinary:  [] ?Chronic kidney disease   [x] ?Difficult urination  [] ?Frequent urination  [] ?Burning with urination   [x] ?Hematuria Skin:  [] ?Rashes   [] ?Ulcers   [] ?Wounds Psychological:  [] ?History of anxiety   [] ? History of major depression.    Physical Examination  Vitals:   06/02/19 0850 06/02/19 0900 06/02/19 1000 06/02/19 1100  BP: 130/78     Pulse: 86 86    Resp:  18 15 20 20   Temp: 98 F (36.7 C)     TempSrc: Oral     SpO2: 99% 100%    Weight:      Height:       Body mass index is 22.33 kg/m. Gen: WD/WN, NAD.  Somewhat frail.  Appears older than stated age Head: [place]/AT, + temporalis wasting. Ear/Nose/Throat: Hearing grossly intact, nares w/o erythema or drainage, oropharynx w/o Erythema/Exudate,  Eyes: Conjunctiva clear, sclera non-icteric Neck: Trachea midline.  No JVD.  Pulmonary:  Good air movement, respirations not labored, no use of accessory muscles.  Cardiac: RRR, normal S1, S2. Vascular:  Vessel Right Left  Radial Palpable Palpable                          PT  1+ palpable  1+ palpable  DP  1+ palpable  trace palpable   Gastrointestinal: soft, non-tender/non-distended. No guarding/reflex.  Musculoskeletal: M/S 5/5 throughout.  Extremities without ischemic changes.  No deformity or atrophy.  Neurologic: Sensation grossly intact in extremities.  Symmetrical.  Speech is fluent. Motor exam as listed above. Psychiatric: Judgment intact, Mood & affect appropriate for pt's clinical situation. Dermatologic: No rashes or ulcers noted.  No cellulitis or open wounds.      CBC Lab Results  Component Value Date   WBC 5.6 06/02/2019   HGB 9.7 (L) 06/02/2019   HCT 30.2 (L) 06/02/2019   MCV 91.0 06/02/2019   PLT 71 (L) 06/02/2019    BMET    Component Value Date/Time   NA 135 06/02/2019 0500   K 4.0 06/02/2019 0500   CL 104  06/02/2019 0500   CO2 22 06/02/2019 0500   GLUCOSE 311 (H) 06/02/2019 0500   BUN 10 06/02/2019 0500   CREATININE 0.48 06/02/2019 0500   CALCIUM 8.0 (L) 06/02/2019 0500   GFRNONAA >60 06/02/2019 0500   GFRAA >60 06/02/2019 0500   Estimated Creatinine Clearance: 57.1 mL/min (by C-G formula based on SCr of 0.48 mg/dL).  COAG Lab Results  Component Value Date   INR 0.9 05/22/2019    Radiology Vascular C-arm Images Only  Result Date: 05/30/2019 Please refer to notes tab for details about interventional procedure. (Op Note)    Assessment/Plan 1.  PAD.  Here today for extensive intervention to both lower extremities for improvement of perfusion to reduce her rest pain.  Risks and benefits are discussed.  Informed consent is obtained. 2.  Cholangiocarcinoma.  Currently undergoing chemotherapy and treatment and this will impair wound healing and make her higher risk. 3.  Diabetes.  Reasonably stable on outpatient medication and clearly an underlying cause of her severe peripheral arterial disease. blood glucose control important in reducing the progression of atherosclerotic disease. Also, involved in wound healing. On appropriate medications. 4.  Hypertension.  Stable on outpatient medications and blood pressure control important in reducing the progression of atherosclerotic disease. On appropriate oral medications.    Leotis Pain, MD  06/05/2019 5:02 PM

## 2019-06-08 ENCOUNTER — Other Ambulatory Visit: Payer: Self-pay

## 2019-06-08 ENCOUNTER — Ambulatory Visit (INDEPENDENT_AMBULATORY_CARE_PROVIDER_SITE_OTHER): Payer: BC Managed Care – PPO | Admitting: Nurse Practitioner

## 2019-06-08 ENCOUNTER — Encounter (INDEPENDENT_AMBULATORY_CARE_PROVIDER_SITE_OTHER): Payer: Self-pay | Admitting: Nurse Practitioner

## 2019-06-08 VITALS — BP 113/68 | HR 99 | Resp 18 | Ht 61.0 in | Wt 116.0 lb

## 2019-06-08 DIAGNOSIS — E119 Type 2 diabetes mellitus without complications: Secondary | ICD-10-CM

## 2019-06-08 DIAGNOSIS — I10 Essential (primary) hypertension: Secondary | ICD-10-CM

## 2019-06-08 DIAGNOSIS — I739 Peripheral vascular disease, unspecified: Secondary | ICD-10-CM

## 2019-06-11 ENCOUNTER — Encounter (INDEPENDENT_AMBULATORY_CARE_PROVIDER_SITE_OTHER): Payer: Self-pay | Admitting: Nurse Practitioner

## 2019-06-11 NOTE — Progress Notes (Signed)
SUBJECTIVE:  Patient ID: Courtney Rich, female    DOB: 10/31/1960, 58 y.o.   MRN: [id number] Chief Complaint  Patient presents with  . Follow-up    post op incision check    HPI  Courtney Rich is a 58 y.o. female that presents today after extensive revascularization.  1. Left common femoral and profunda femoris endarterectomy with Cormatrix patch angioplasty 2. Right common femoral, superficial femoral and profunda femoris endarterectomy with Cormatrix patch angioplasty 3. Bilateral common iliac artery stents with 8 mm x 58 mm lifestream stents using the kissing balloon technique 4. Bilateral external iliac artery stents with a combination of life star stents and Viabahn stents 5. Introduction catheter into the aorta bilateral common femoral artery approach  The patient reports that today her walking feels much better as compared to prior to her surgery.  She denies little issue with pain.  The patient does endorse that she has some left lower extremity swelling however it is much decreased.  The patient also has some postoperative nausea but no vomiting.  The patient continues to be nauseous however she states that this is likely due to her recent chemotherapy.  Overall the patient states that she is doing well.  The patient has bilateral groin incisions which are clean dry and intact with no evidence of dehiscence.  She denies any fever, chills, vomiting, or diarrhea  Past Medical History:  Diagnosis Date  . A-fib ([place])   . Allergic genetic state   . Cancer ([place]) 10/2018   liver   . Chronic bronchitis with emphysema ([place])   . Depression   . Depression   . Diabetes mellitus without complication ([place])   . Glaucoma (increased eye pressure)   . History of kidney stones   . Hyperlipidemia   . Hypertension   . Ischemic cardiomyopathy   . Left bundle branch block (LBBB)   . Left bundle branch block (LBBB)   . PONV (postoperative nausea and vomiting)   . Proliferative diabetic  retinopathy of both eyes ([place])   . Proliferative diabetic retinopathy of both eyes [facility])     Past Surgical History:  Procedure Laterality Date  . CARDIAC SURGERY    . CORONARY ARTERY BYPASS GRAFT     triple bypass   . ENDARTERECTOMY FEMORAL Bilateral 05/30/2019   Procedure: ENDARTERECTOMY FEMORAL;  Surgeon: Algernon Huxley, MD;  Location: [facility] ORS;  Service: Vascular;  Laterality: Bilateral;  . EYE SURGERY    . INSERTION OF ILIAC STENT Bilateral 05/30/2019   Procedure: INSERTION OF ILIAC STENT;  Surgeon: Algernon Huxley, MD;  Location: [facility] ORS;  Service: Vascular;  Laterality: Bilateral;  . KIDNEY STONE SURGERY    . LOWER EXTREMITY ANGIOGRAPHY Right 05/14/2019   Procedure: LOWER EXTREMITY ANGIOGRAPHY;  Surgeon: Algernon Huxley, MD;  Location: [place] [facility];  Service: Cardiovascular;  Laterality: Right;  . TUBAL LIGATION    . WRIST FRACTURE SURGERY Left     Social History   Socioeconomic History  . Marital status: Married    Spouse name: Not on file  . Number of children: Not on file  . Years of education: Not on file  . Highest education level: Not on file  Occupational History  . Not on file  Social Needs  . Financial resource strain: Not on file  . Food insecurity    Worry: Not on file    Inability: Not on file  . Transportation needs    Medical: Not on file  Non-medical: Not on file  Tobacco Use  . Smoking status: Current Every Day Smoker    Years: 42.00  . Smokeless tobacco: Never Used  Substance and Sexual Activity  . Alcohol use: No  . Drug use: Never  . Sexual activity: Not Currently  Lifestyle  . Physical activity    Days per week: Not on file    Minutes per session: Not on file  . Stress: Not on file  Relationships  . Social Herbalist on phone: Not on file    Gets together: Not on file    Attends religious service: Not on file    Active member of club or organization: Not on file    Attends meetings of clubs or organizations: Not on file     Relationship status: Not on file  . Intimate partner violence    Fear of current or ex partner: Not on file    Emotionally abused: Not on file    Physically abused: Not on file    Forced sexual activity: Not on file  Other Topics Concern  . Not on file  Social History Narrative  . Not on file    Family History  Problem Relation Age of Onset  . Breast cancer Neg Hx     Allergies  Allergen Reactions  . Dapagliflozin Nausea Only  . Empagliflozin Nausea Only  . Lisinopril     Muscle Pain  . Other   . Sulfa Antibiotics Swelling    Hives     Review of Systems   Review of Systems: Negative Unless Checked Constitutional: [] Weight loss  [] Fever  [] Chills Cardiac: [] Chest pain   [x]  Atrial Fibrillation  [] Palpitations   [] Shortness of breath when laying flat   [] Shortness of breath with exertion. [] Shortness of breath at rest Vascular:  [] Pain in legs with walking   [] Pain in legs with standing [] Pain in legs when laying flat   [] Claudication    [] Pain in feet when laying flat    [] History of DVT   [] Phlebitis   [] Swelling in legs   [] Varicose veins   [] Non-healing ulcers Pulmonary:   [] Uses home oxygen   [] Productive cough   [] Hemoptysis   [] Wheeze  [] COPD   [] Asthma Neurologic:  [] Dizziness   [] Seizures  [] Blackouts [] History of stroke   [] History of TIA  [] Aphasia   [] Temporary Blindness   [] Weakness or numbness in arm   [x] Weakness or numbness in leg Musculoskeletal:   [] Joint swelling   [] Joint pain   [] Low back pain  []  History of Knee Replacement [] Arthritis [] back Surgeries  []  Spinal Stenosis    Hematologic:  [] Easy bruising  [] Easy bleeding   [] Hypercoagulable state   [] Anemic Gastrointestinal:  [] Diarrhea   [] Vomiting  [] Gastroesophageal reflux/heartburn   [] Difficulty swallowing. [] Abdominal pain Genitourinary:  [] Chronic kidney disease   [] Difficult urination  [] Anuric   [] Blood in urine [] Frequent urination  [] Burning with urination   [] Hematuria Skin:  [] Rashes    [] Ulcers [] Wounds Psychological:  [x] History of anxiety   [x]  History of major depression  []  Memory Difficulties      OBJECTIVE:   Physical Exam  BP 113/68 (BP Location: Right Arm)   Pulse 99   Resp 18   Ht 5\' 1"  (1.549 m)   Wt 116 lb (52.6 kg)   BMI 21.92 kg/m   Gen: WD/WN, NAD Head: [place]/AT, No temporalis wasting.  Ear/Nose/Throat: Hearing grossly intact, nares w/o erythema or drainage Eyes: PER, EOMI, sclera nonicteric.  Neck: Supple, no masses.  No JVD.  Pulmonary:  Good air movement, no use of accessory muscles.  Cardiac: RRR Vascular:  11+ edema left lower extremity.  Bilateral groin wounds with staples appear to be clean dry and intact, no evidence of dehiscence, no drainage Vessel Right Left  Radial Palpable Palpable  Dorsalis Pedis Palpable Palpable  Posterior Tibial Palpable Palpable   Gastrointestinal: soft, non-distended. No guarding/no peritoneal signs.  Musculoskeletal: M/S 5/5 throughout.  No deformity or atrophy.  Neurologic: Pain and light touch intact in extremities.  Symmetrical.  Speech is fluent. Motor exam as listed above. Psychiatric: Judgment intact, Mood & affect appropriate for pt's clinical situation. Dermatologic: No Venous rashes. No Ulcers Noted.  No changes consistent with cellulitis. Lymph : No Cervical lymphadenopathy, no lichenification or skin changes of chronic lymphedema.       ASSESSMENT AND PLAN:  1. PAD (peripheral artery disease) ([place]) Overall patient is doing well postsurgically.  The wounds look clean dry and intact with no evidence of infection.  We will have the patient return in 2 weeks to begin suture removal.  2. Type 2 diabetes mellitus without complication, without long-term current use of insulin (HCC) Continue hypoglycemic medications as already ordered, these medications have been reviewed and there are no changes at this time.  Hgb A1C to be monitored as already arranged by primary service   3. Essential  hypertension Continue antihypertensive medications as already ordered, these medications have been reviewed and there are no changes at this time.    Current Outpatient Medications on File Prior to Visit  Medication Sig Dispense Refill  . albuterol (PROVENTIL HFA;VENTOLIN HFA) 108 (90 Base) MCG/ACT inhaler Inhale 2 puffs into the lungs every 4 (four) hours as needed for wheezing or shortness of breath.    [name] aspirin EC 81 MG EC tablet Take 1 tablet (81 mg total) by mouth daily at 6 (six) AM. 1 tablet 1  . brimonidine (ALPHAGAN) 0.2 % ophthalmic solution Place into the right eye 2 (two) times daily.    . carvedilol (COREG) 3.125 MG tablet Take 3.125 mg by mouth 2 (two) times daily with a meal.    . cetirizine (ZYRTEC) 10 MG tablet Take 10 mg by mouth daily.    . cholecalciferol (VITAMIN D) 1000 units tablet Take 1,000 Units by mouth daily.    . clopidogrel (PLAVIX) 75 MG tablet Take 1 tablet (75 mg total) by mouth daily. 30 tablet 11  . glipiZIDE (GLUCOTROL) 10 MG tablet Take 10 mg by mouth daily before breakfast.    . isosorbide mononitrate (IMDUR) 30 MG 24 hr tablet Take 30 mg by mouth daily.    [name] losartan (COZAAR) 25 MG tablet Take 25 mg by mouth daily.    . metFORMIN (GLUCOPHAGE) 500 MG tablet Take 1,000 mg by mouth 2 (two) times daily with a meal.     . methimazole (TAPAZOLE) 5 MG tablet Take 5 mg by mouth daily.     [name] oxyCODONE (OXY IR/ROXICODONE) 5 MG immediate release tablet Take 1-2 tablets (5-10 mg total) by mouth every 4 (four) hours as needed for moderate pain. 30 tablet 0  . pravastatin (PRAVACHOL) 40 MG tablet Take 40 mg by mouth daily.    . sertraline (ZOLOFT) 50 MG tablet Take 50 mg by mouth daily.    . timolol (TIMOPTIC) 0.25 % ophthalmic solution Place 1 drop into the right eye 2 (two) times daily.    [name] tiotropium (SPIRIVA) 18 MCG inhalation capsule Place 18  mcg into inhaler and inhale daily.    . clopidogrel (PLAVIX) 75 MG tablet Take 1 tablet (75 mg total) by mouth daily at  6 (six) AM. 1 tablet 5  . ketoconazole (NIZORAL) 2 % cream Apply 1 application topically 2 (two) times daily.     No current facility-administered medications on file prior to visit.     There are no Patient Instructions on file for this visit. No follow-ups on file.   Kris Hartmann, NP  This note was completed with Sales executive.  Any errors are purely unintentional.

## 2019-06-20 ENCOUNTER — Other Ambulatory Visit: Payer: Self-pay

## 2019-06-20 ENCOUNTER — Ambulatory Visit (INDEPENDENT_AMBULATORY_CARE_PROVIDER_SITE_OTHER): Payer: BC Managed Care – PPO | Admitting: Nurse Practitioner

## 2019-06-20 ENCOUNTER — Encounter (INDEPENDENT_AMBULATORY_CARE_PROVIDER_SITE_OTHER): Payer: Self-pay | Admitting: Nurse Practitioner

## 2019-06-20 VITALS — BP 139/78 | HR 103 | Resp 16 | Wt 116.0 lb

## 2019-06-20 DIAGNOSIS — I70213 Atherosclerosis of native arteries of extremities with intermittent claudication, bilateral legs: Secondary | ICD-10-CM

## 2019-06-20 DIAGNOSIS — E119 Type 2 diabetes mellitus without complications: Secondary | ICD-10-CM

## 2019-06-20 DIAGNOSIS — J449 Chronic obstructive pulmonary disease, unspecified: Secondary | ICD-10-CM

## 2019-06-26 ENCOUNTER — Other Ambulatory Visit: Payer: Self-pay

## 2019-06-26 ENCOUNTER — Ambulatory Visit (INDEPENDENT_AMBULATORY_CARE_PROVIDER_SITE_OTHER): Payer: BC Managed Care – PPO | Admitting: Vascular Surgery

## 2019-06-26 ENCOUNTER — Encounter (INDEPENDENT_AMBULATORY_CARE_PROVIDER_SITE_OTHER): Payer: Self-pay | Admitting: Nurse Practitioner

## 2019-06-26 ENCOUNTER — Encounter (INDEPENDENT_AMBULATORY_CARE_PROVIDER_SITE_OTHER): Payer: Self-pay | Admitting: Vascular Surgery

## 2019-06-26 VITALS — BP 118/73 | HR 112 | Resp 18 | Ht 61.0 in | Wt 114.0 lb

## 2019-06-26 DIAGNOSIS — I10 Essential (primary) hypertension: Secondary | ICD-10-CM

## 2019-06-26 DIAGNOSIS — I70213 Atherosclerosis of native arteries of extremities with intermittent claudication, bilateral legs: Secondary | ICD-10-CM

## 2019-06-26 DIAGNOSIS — C221 Intrahepatic bile duct carcinoma: Secondary | ICD-10-CM

## 2019-06-26 NOTE — Progress Notes (Signed)
Patient ID: Courtney Rich, female   DOB: 1960/10/30, 58 y.o.   MRN: [id number]  Chief Complaint  Patient presents with  . Follow-up    1 week staple removal    HPI Courtney Rich is a 58 y.o. female.  Patient returns in follow-up.  Her legs continue to do well and are feeling much better after revascularization.  Her wounds are healing well.  The remainder of her staples were removed today.  Her claudication symptoms are basically gone.  She does still have some tiredness and aching in her legs at night.  No ulceration or infection.   Past Medical History:  Diagnosis Date  . A-fib ([place])   . Allergic genetic state   . Cancer ([place]) 10/2018   liver   . Chronic bronchitis with emphysema ([place])   . Depression   . Depression   . Diabetes mellitus without complication ([place])   . Glaucoma (increased eye pressure)   . History of kidney stones   . Hyperlipidemia   . Hypertension   . Ischemic cardiomyopathy   . Left bundle branch block (LBBB)   . Left bundle branch block (LBBB)   . PONV (postoperative nausea and vomiting)   . Proliferative diabetic retinopathy of both eyes ([place])   . Proliferative diabetic retinopathy of both eyes [facility])     Past Surgical History:  Procedure Laterality Date  . CARDIAC SURGERY    . CORONARY ARTERY BYPASS GRAFT     triple bypass   . ENDARTERECTOMY FEMORAL Bilateral 05/30/2019   Procedure: ENDARTERECTOMY FEMORAL;  Surgeon: Algernon Huxley, MD;  Location: [facility] ORS;  Service: Vascular;  Laterality: Bilateral;  . EYE SURGERY    . INSERTION OF ILIAC STENT Bilateral 05/30/2019   Procedure: INSERTION OF ILIAC STENT;  Surgeon: Algernon Huxley, MD;  Location: [facility] ORS;  Service: Vascular;  Laterality: Bilateral;  . KIDNEY STONE SURGERY    . LOWER EXTREMITY ANGIOGRAPHY Right 05/14/2019   Procedure: LOWER EXTREMITY ANGIOGRAPHY;  Surgeon: Algernon Huxley, MD;  Location: [place] [facility];  Service: Cardiovascular;  Laterality: Right;  . TUBAL LIGATION    . WRIST  FRACTURE SURGERY Left       Allergies  Allergen Reactions  . Dapagliflozin Nausea Only  . Empagliflozin Nausea Only  . Lisinopril     Muscle Rich  . Other   . Sulfa Antibiotics Swelling    Hives    Current Outpatient Medications  Medication Sig Dispense Refill  . albuterol (PROVENTIL HFA;VENTOLIN HFA) 108 (90 Base) MCG/ACT inhaler Inhale 2 puffs into the lungs every 4 (four) hours as needed for wheezing or shortness of breath.    [name] aspirin EC 81 MG EC tablet Take 1 tablet (81 mg total) by mouth daily at 6 (six) AM. 1 tablet 1  . brimonidine (ALPHAGAN) 0.2 % ophthalmic solution Place into the right eye 2 (two) times daily.    . carvedilol (COREG) 3.125 MG tablet Take 3.125 mg by mouth 2 (two) times daily with a meal.    . cetirizine (ZYRTEC) 10 MG tablet Take 10 mg by mouth daily.    . cholecalciferol (VITAMIN D) 1000 units tablet Take 1,000 Units by mouth daily.    . clopidogrel (PLAVIX) 75 MG tablet Take 1 tablet (75 mg total) by mouth daily. 30 tablet 11  . clopidogrel (PLAVIX) 75 MG tablet Take 1 tablet (75 mg total) by mouth daily at 6 (six) AM. 1 tablet 5  . glipiZIDE (GLUCOTROL) 10  MG tablet Take 10 mg by mouth daily before breakfast.    . isosorbide mononitrate (IMDUR) 30 MG 24 hr tablet Take 30 mg by mouth daily.    [name] ketoconazole (NIZORAL) 2 % cream Apply 1 application topically 2 (two) times daily.    [name] losartan (COZAAR) 25 MG tablet Take 25 mg by mouth daily.    . metFORMIN (GLUCOPHAGE) 500 MG tablet Take 1,000 mg by mouth 2 (two) times daily with a meal.     . methimazole (TAPAZOLE) 5 MG tablet Take 5 mg by mouth daily.     [name] oxyCODONE (OXY IR/ROXICODONE) 5 MG immediate release tablet Take 1-2 tablets (5-10 mg total) by mouth every 4 (four) hours as needed for moderate Rich. 30 tablet 0  . pravastatin (PRAVACHOL) 40 MG tablet Take 40 mg by mouth daily.    . sertraline (ZOLOFT) 50 MG tablet Take 50 mg by mouth daily.    . timolol (TIMOPTIC) 0.25 % ophthalmic solution  Place 1 drop into the right eye 2 (two) times daily.    [name] tiotropium (SPIRIVA) 18 MCG inhalation capsule Place 18 mcg into inhaler and inhale daily.     No current facility-administered medications for this visit.         Physical Exam BP 118/73 (BP Location: Right Arm)   Pulse (!) 112   Resp 18   Ht 5\' 1"  (1.549 m)   Wt 114 lb (51.7 kg)   BMI 21.54 kg/m  Gen:  WD/WN, NAD Skin: incision C/D/I.  Good pedal pulses present.     Assessment/Plan:  Intrahepatic cholangiocarcinoma ([place]) Still undergoing chemotherapy.  For CT scan later this month to assess response.  Essential hypertension blood pressure control important in reducing the progression of atherosclerotic disease. On appropriate oral medications.   Atherosclerosis of native arteries of extremity with intermittent claudication (HCC) Symptoms improved after revascularization.  Plan to recheck perfusion in about 3 months.      Courtney Rich 06/26/2019, 10:20 AM   This note was created with Dragon medical transcription system.  Any errors from dictation are unintentional.

## 2019-06-26 NOTE — Assessment & Plan Note (Signed)
Still undergoing chemotherapy.  For CT scan later this month to assess response.

## 2019-06-26 NOTE — Progress Notes (Signed)
SUBJECTIVE:  Patient ID: Courtney Rich, female    DOB: 22-Jan-1961, 58 y.o.   MRN: [id number] Chief Complaint  Patient presents with  . Follow-up    1-2 week staple removal    HPI  Courtney Rich is a 58 y.o. female that presents today for incision check and wound removal after bilateral femoral endarterectomy.  Overall, the incisions look well.  No signs symptoms of infection.  Bilateral incisions are clean dry intact.  Patient is ambulating well.  Patient is also going through chemotherapy treatment so she does have some nausea at times.  Past Medical History:  Diagnosis Date  . A-fib ([place])   . Allergic genetic state   . Cancer ([place]) 10/2018   liver   . Chronic bronchitis with emphysema ([place])   . Depression   . Depression   . Diabetes mellitus without complication ([place])   . Glaucoma (increased eye pressure)   . History of kidney stones   . Hyperlipidemia   . Hypertension   . Ischemic cardiomyopathy   . Left bundle branch block (LBBB)   . Left bundle branch block (LBBB)   . PONV (postoperative nausea and vomiting)   . Proliferative diabetic retinopathy of both eyes ([place])   . Proliferative diabetic retinopathy of both eyes [facility])     Past Surgical History:  Procedure Laterality Date  . CARDIAC SURGERY    . CORONARY ARTERY BYPASS GRAFT     triple bypass   . ENDARTERECTOMY FEMORAL Bilateral 05/30/2019   Procedure: ENDARTERECTOMY FEMORAL;  Surgeon: Algernon Huxley, MD;  Location: [facility] ORS;  Service: Vascular;  Laterality: Bilateral;  . EYE SURGERY    . INSERTION OF ILIAC STENT Bilateral 05/30/2019   Procedure: INSERTION OF ILIAC STENT;  Surgeon: Algernon Huxley, MD;  Location: [facility] ORS;  Service: Vascular;  Laterality: Bilateral;  . KIDNEY STONE SURGERY    . LOWER EXTREMITY ANGIOGRAPHY Right 05/14/2019   Procedure: LOWER EXTREMITY ANGIOGRAPHY;  Surgeon: Algernon Huxley, MD;  Location: [place] CV LAB;  Service: Cardiovascular;  Laterality: Right;  . TUBAL LIGATION    . WRIST  FRACTURE SURGERY Left     Social History   Socioeconomic History  . Marital status: Married    Spouse name: Not on file  . Number of children: Not on file  . Years of education: Not on file  . Highest education level: Not on file  Occupational History  . Not on file  Social Needs  . Financial resource strain: Not on file  . Food insecurity    Worry: Not on file    Inability: Not on file  . Transportation needs    Medical: Not on file    Non-medical: Not on file  Tobacco Use  . Smoking status: Current Every Day Smoker    Years: 42.00  . Smokeless tobacco: Never Used  Substance and Sexual Activity  . Alcohol use: No  . Drug use: Never  . Sexual activity: Not Currently  Lifestyle  . Physical activity    Days per week: Not on file    Minutes per session: Not on file  . Stress: Not on file  Relationships  . Social Herbalist on phone: Not on file    Gets together: Not on file    Attends religious service: Not on file    Active member of club or organization: Not on file    Attends meetings of clubs or organizations: Not on file  Relationship status: Not on file  . Intimate partner violence    Fear of current or ex partner: Not on file    Emotionally abused: Not on file    Physically abused: Not on file    Forced sexual activity: Not on file  Other Topics Concern  . Not on file  Social History Narrative  . Not on file    Family History  Problem Relation Age of Onset  . Breast cancer Neg Hx     Allergies  Allergen Reactions  . Dapagliflozin Nausea Only  . Empagliflozin Nausea Only  . Lisinopril     Muscle Pain  . Other   . Sulfa Antibiotics Swelling    Hives     Review of Systems   Review of Systems: Negative Unless Checked Constitutional: [] Weight loss  [] Fever  [] Chills Cardiac: [] Chest pain   []  Atrial Fibrillation  [] Palpitations   [] Shortness of breath when laying flat   [] Shortness of breath with exertion. [] Shortness of breath at  rest Vascular:  [] Pain in legs with walking   [] Pain in legs with standing [] Pain in legs when laying flat   [x] Claudication    [] Pain in feet when laying flat    [] History of DVT   [] Phlebitis   [] Swelling in legs   [] Varicose veins   [] Non-healing ulcers Pulmonary:   [] Uses home oxygen   [] Productive cough   [] Hemoptysis   [] Wheeze  [x] COPD   [] Asthma Neurologic:  [] Dizziness   [] Seizures  [] Blackouts [] History of stroke   [] History of TIA  [] Aphasia   [] Temporary Blindness   [] Weakness or numbness in arm   [] Weakness or numbness in leg Musculoskeletal:   [] Joint swelling   [] Joint pain   [] Low back pain  []  History of Knee Replacement [] Arthritis [] back Surgeries  []  Spinal Stenosis    Hematologic:  [] Easy bruising  [] Easy bleeding   [] Hypercoagulable state   [] Anemic Gastrointestinal:  [] Diarrhea   [] Vomiting  [] Gastroesophageal reflux/heartburn   [] Difficulty swallowing. [] Abdominal pain Genitourinary:  [] Chronic kidney disease   [] Difficult urination  [] Anuric   [] Blood in urine [] Frequent urination  [] Burning with urination   [] Hematuria Skin:  [] Rashes   [] Ulcers [] Wounds Psychological:  [x] History of anxiety   [x]  History of major depression  []  Memory Difficulties      OBJECTIVE:   Physical Exam  BP 139/78 (BP Location: Right Arm)   Pulse (!) 103   Resp 16   Wt 116 lb (52.6 kg)   BMI 21.92 kg/m   Gen: WD/WN, NAD Head: [place]/AT, No temporalis wasting.  Ear/Nose/Throat: Hearing grossly intact, nares w/o erythema or drainage Eyes: PER, EOMI, sclera nonicteric.  Neck: Supple, no masses.  No JVD.  Pulmonary:  Good air movement, no use of accessory muscles.  Cardiac: RRR Vascular:  Vessel Right Left  Radial Palpable Palpable  Dorsalis Pedis Palpable Palpable  Posterior Tibial Palpable Palpable   Gastrointestinal: soft, non-distended. No guarding/no peritoneal signs.  Musculoskeletal: M/S 5/5 throughout.  No deformity or atrophy.  Neurologic: Pain and light touch intact in  extremities.  Symmetrical.  Speech is fluent. Motor exam as listed above. Psychiatric: Judgment intact, Mood & affect appropriate for pt's clinical situation. Dermatologic: No Venous rashes. No Ulcers Noted.  No changes consistent with cellulitis. Lymph : No Cervical lymphadenopathy, no lichenification or skin changes of chronic lymphedema.       ASSESSMENT AND PLAN:  1. Atherosclerosis of native artery of both lower extremities with intermittent claudication [facility]) The patient return  in 1 to 2 weeks to remove the rest of her staples.  Overall the incisions look well.  Patient is instructed to keep wound clean dry and intact.  She is also instructed to give [place] a call if they begin to open or he has she should contact our office for a sooner appointment.  2. Chronic bronchitis with emphysema (HCC) Continue pulmonary medications and aerosols as already ordered, these medications have been reviewed and there are no changes at this time.    3. Type 2 diabetes mellitus without complication, without long-term current use of insulin (HCC) Continue hypoglycemic medications as already ordered, these medications have been reviewed and there are no changes at this time.  Hgb A1C to be monitored as already arranged by primary service    Current Outpatient Medications on File Prior to Visit  Medication Sig Dispense Refill  . albuterol (PROVENTIL HFA;VENTOLIN HFA) 108 (90 Base) MCG/ACT inhaler Inhale 2 puffs into the lungs every 4 (four) hours as needed for wheezing or shortness of breath.    [name] aspirin EC 81 MG EC tablet Take 1 tablet (81 mg total) by mouth daily at 6 (six) AM. 1 tablet 1  . brimonidine (ALPHAGAN) 0.2 % ophthalmic solution Place into the right eye 2 (two) times daily.    . carvedilol (COREG) 3.125 MG tablet Take 3.125 mg by mouth 2 (two) times daily with a meal.    . cetirizine (ZYRTEC) 10 MG tablet Take 10 mg by mouth daily.    . cholecalciferol (VITAMIN D) 1000 units tablet Take  1,000 Units by mouth daily.    . clopidogrel (PLAVIX) 75 MG tablet Take 1 tablet (75 mg total) by mouth daily. 30 tablet 11  . clopidogrel (PLAVIX) 75 MG tablet Take 1 tablet (75 mg total) by mouth daily at 6 (six) AM. 1 tablet 5  . glipiZIDE (GLUCOTROL) 10 MG tablet Take 10 mg by mouth daily before breakfast.    . isosorbide mononitrate (IMDUR) 30 MG 24 hr tablet Take 30 mg by mouth daily.    [name] ketoconazole (NIZORAL) 2 % cream Apply 1 application topically 2 (two) times daily.    [name] losartan (COZAAR) 25 MG tablet Take 25 mg by mouth daily.    . metFORMIN (GLUCOPHAGE) 500 MG tablet Take 1,000 mg by mouth 2 (two) times daily with a meal.     . methimazole (TAPAZOLE) 5 MG tablet Take 5 mg by mouth daily.     [name] oxyCODONE (OXY IR/ROXICODONE) 5 MG immediate release tablet Take 1-2 tablets (5-10 mg total) by mouth every 4 (four) hours as needed for moderate pain. 30 tablet 0  . pravastatin (PRAVACHOL) 40 MG tablet Take 40 mg by mouth daily.    . sertraline (ZOLOFT) 50 MG tablet Take 50 mg by mouth daily.    . timolol (TIMOPTIC) 0.25 % ophthalmic solution Place 1 drop into the right eye 2 (two) times daily.    [name] tiotropium (SPIRIVA) 18 MCG inhalation capsule Place 18 mcg into inhaler and inhale daily.     No current facility-administered medications on file prior to visit.     There are no Patient Instructions on file for this visit. No follow-ups on file.   Kris Hartmann, NP  This note was completed with Sales executive.  Any errors are purely unintentional.

## 2019-06-26 NOTE — Assessment & Plan Note (Signed)
Symptoms improved after revascularization.  Plan to recheck perfusion in about 3 months.

## 2019-06-26 NOTE — Assessment & Plan Note (Signed)
blood pressure control important in reducing the progression of atherosclerotic disease. On appropriate oral medications.  

## 2019-06-26 NOTE — Patient Instructions (Signed)

## 2019-06-27 ENCOUNTER — Ambulatory Visit (INDEPENDENT_AMBULATORY_CARE_PROVIDER_SITE_OTHER): Payer: BC Managed Care – PPO | Admitting: Nurse Practitioner

## 2019-06-28 ENCOUNTER — Ambulatory Visit (INDEPENDENT_AMBULATORY_CARE_PROVIDER_SITE_OTHER): Payer: BC Managed Care – PPO | Admitting: Nurse Practitioner

## 2019-07-22 ENCOUNTER — Emergency Department: Payer: BC Managed Care – PPO

## 2019-07-22 ENCOUNTER — Emergency Department
Admission: EM | Admit: 2019-07-22 | Discharge: 2019-07-22 | Disposition: A | Discharge status: Home / Self Care | Payer: BC Managed Care – PPO | Attending: Emergency Medicine | Admitting: Emergency Medicine

## 2019-07-22 ENCOUNTER — Other Ambulatory Visit: Payer: Self-pay

## 2019-07-22 DIAGNOSIS — R05 Cough: Secondary | ICD-10-CM | POA: Insufficient documentation

## 2019-07-22 DIAGNOSIS — J449 Chronic obstructive pulmonary disease, unspecified: Secondary | ICD-10-CM | POA: Diagnosis not present

## 2019-07-22 DIAGNOSIS — R0602 Shortness of breath: Secondary | ICD-10-CM | POA: Diagnosis present

## 2019-07-22 DIAGNOSIS — Z5321 Procedure and treatment not carried out due to patient leaving prior to being seen by health care provider: Secondary | ICD-10-CM | POA: Insufficient documentation

## 2019-07-22 NOTE — ED Notes (Signed)
Pt wants port accessed for lab work and will wait for room for labs.

## 2019-07-22 NOTE — ED Triage Notes (Signed)
PT arrived via POV with reports of worsening shortness of breath over the past several weeks, worse over the past 2 days.  PT states she came today because she is tired of not being able to breathe.  Pt has hx of COPD, pt is current smoker-1/2 PPD.  PT coughing in triage, pt states she uses spirivia and albuterol daily.

## 2019-09-25 ENCOUNTER — Encounter (INDEPENDENT_AMBULATORY_CARE_PROVIDER_SITE_OTHER): Payer: BC Managed Care – PPO

## 2019-09-25 ENCOUNTER — Ambulatory Visit (INDEPENDENT_AMBULATORY_CARE_PROVIDER_SITE_OTHER): Payer: BC Managed Care – PPO | Admitting: Vascular Surgery

## 2019-10-23 ENCOUNTER — Encounter (INDEPENDENT_AMBULATORY_CARE_PROVIDER_SITE_OTHER): Payer: Self-pay | Admitting: Vascular Surgery

## 2019-10-23 ENCOUNTER — Ambulatory Visit (INDEPENDENT_AMBULATORY_CARE_PROVIDER_SITE_OTHER): Payer: BC Managed Care – PPO

## 2019-10-23 ENCOUNTER — Ambulatory Visit (INDEPENDENT_AMBULATORY_CARE_PROVIDER_SITE_OTHER): Payer: BC Managed Care – PPO | Admitting: Vascular Surgery

## 2019-10-23 ENCOUNTER — Other Ambulatory Visit: Payer: Self-pay

## 2019-10-23 VITALS — BP 107/68 | HR 102 | Resp 18 | Ht 60.0 in | Wt 119.0 lb

## 2019-10-23 DIAGNOSIS — I70213 Atherosclerosis of native arteries of extremities with intermittent claudication, bilateral legs: Secondary | ICD-10-CM

## 2019-10-23 DIAGNOSIS — E119 Type 2 diabetes mellitus without complications: Secondary | ICD-10-CM

## 2019-10-23 DIAGNOSIS — I10 Essential (primary) hypertension: Secondary | ICD-10-CM | POA: Diagnosis not present

## 2019-10-23 NOTE — Assessment & Plan Note (Signed)
blood pressure control important in reducing the progression of atherosclerotic disease. On appropriate oral medications.  

## 2019-10-23 NOTE — Assessment & Plan Note (Signed)
blood glucose control important in reducing the progression of atherosclerotic disease. Also, involved in wound healing. On appropriate medications.  

## 2019-10-23 NOTE — Progress Notes (Signed)
MRN : [id number]  Courtney Rich is a 59 y.o. (Aug 19, 1960) female who presents with chief complaint of  Chief Complaint  Patient presents with  . Follow-up    ultrasound follow up   .  History of Present Illness: Patient returns today in follow up of her PAD.  Almost 5 months ago, she underwent extensive bilateral lower extremity revascularization and has done quite well.  She says her legs really are not bothering her at this point.  She has brisk strong waveforms with an ABI of 1.28 on the right and 0.91 on the left.  Her digit pressure is 129 on the right and 85 on the left.  Current Outpatient Medications  Medication Sig Dispense Refill  . albuterol (PROVENTIL HFA;VENTOLIN HFA) 108 (90 Base) MCG/ACT inhaler Inhale 2 puffs into the lungs every 4 (four) hours as needed for wheezing or shortness of breath.    [name] aspirin EC 81 MG EC tablet Take 1 tablet (81 mg total) by mouth daily at 6 (six) AM. 1 tablet 1  . brimonidine (ALPHAGAN) 0.2 % ophthalmic solution Place into the right eye 2 (two) times daily.    . carvedilol (COREG) 3.125 MG tablet Take 3.125 mg by mouth 2 (two) times daily with a meal.    . cetirizine (ZYRTEC) 10 MG tablet Take 10 mg by mouth daily.    . cholecalciferol (VITAMIN D) 1000 units tablet Take 1,000 Units by mouth daily.    . clopidogrel (PLAVIX) 75 MG tablet Take 1 tablet (75 mg total) by mouth daily. 30 tablet 11  . clopidogrel (PLAVIX) 75 MG tablet Take 1 tablet (75 mg total) by mouth daily at 6 (six) AM. 1 tablet 5  . glipiZIDE (GLUCOTROL) 10 MG tablet Take 10 mg by mouth daily before breakfast.    . isosorbide mononitrate (IMDUR) 30 MG 24 hr tablet Take 30 mg by mouth daily.    [name] ketoconazole (NIZORAL) 2 % cream Apply 1 application topically 2 (two) times daily.    [name] losartan (COZAAR) 25 MG tablet Take 25 mg by mouth daily.    . metFORMIN (GLUCOPHAGE) 500 MG tablet Take 1,000 mg by mouth 2 (two) times daily with a meal.     . methimazole (TAPAZOLE) 5 MG tablet  Take 5 mg by mouth daily.     [name] oxyCODONE (OXY IR/ROXICODONE) 5 MG immediate release tablet Take 1-2 tablets (5-10 mg total) by mouth every 4 (four) hours as needed for moderate pain. 30 tablet 0  . pravastatin (PRAVACHOL) 40 MG tablet Take 40 mg by mouth daily.    . sertraline (ZOLOFT) 50 MG tablet Take 50 mg by mouth daily.    . timolol (TIMOPTIC) 0.25 % ophthalmic solution Place 1 drop into the right eye 2 (two) times daily.    [name] tiotropium (SPIRIVA) 18 MCG inhalation capsule Place 18 mcg into inhaler and inhale daily.     No current facility-administered medications for this visit.    Past Medical History:  Diagnosis Date  . A-fib ([place])   . Allergic genetic state   . Cancer ([place]) 10/2018   liver   . Chronic bronchitis with emphysema ([place])   . Depression   . Depression   . Diabetes mellitus without complication ([place])   . Glaucoma (increased eye pressure)   . History of kidney stones   . Hyperlipidemia   . Hypertension   . Ischemic cardiomyopathy   . Left bundle branch block (LBBB)   . Left bundle  branch block (LBBB)   . PONV (postoperative nausea and vomiting)   . Proliferative diabetic retinopathy of both eyes ([place])   . Proliferative diabetic retinopathy of both eyes [facility])     Past Surgical History:  Procedure Laterality Date  . CARDIAC SURGERY    . CORONARY ARTERY BYPASS GRAFT     triple bypass   . ENDARTERECTOMY FEMORAL Bilateral 05/30/2019   Procedure: ENDARTERECTOMY FEMORAL;  Surgeon: Algernon Huxley, MD;  Location: [facility] ORS;  Service: Vascular;  Laterality: Bilateral;  . EYE SURGERY    . INSERTION OF ILIAC STENT Bilateral 05/30/2019   Procedure: INSERTION OF ILIAC STENT;  Surgeon: Algernon Huxley, MD;  Location: [facility] ORS;  Service: Vascular;  Laterality: Bilateral;  . KIDNEY STONE SURGERY    . LOWER EXTREMITY ANGIOGRAPHY Right 05/14/2019   Procedure: LOWER EXTREMITY ANGIOGRAPHY;  Surgeon: Algernon Huxley, MD;  Location: [place] CV LAB;  Service: Cardiovascular;   Laterality: Right;  . TUBAL LIGATION    . WRIST FRACTURE SURGERY Left      Social History   Tobacco Use  . Smoking status: Current Every Day Smoker    Years: 42.00  . Smokeless tobacco: Never Used  Substance Use Topics  . Alcohol use: No  . Drug use: Never    Family History  Problem Relation Age of Onset  . Breast cancer Neg Hx      Allergies  Allergen Reactions  . Dapagliflozin Nausea Only  . Empagliflozin Nausea Only  . Lisinopril     Muscle Pain  . Other   . Sulfa Antibiotics Swelling    Hives     REVIEW OF SYSTEMS (Negative unless checked)  Constitutional: [] Weight loss  [] Fever  [] Chills Cardiac: [] Chest pain   [] Chest pressure   [] Palpitations   [] Shortness of breath when laying flat   [] Shortness of breath at rest   [] Shortness of breath with exertion. Vascular:  [] Pain in legs with walking   [] Pain in legs at rest   [] Pain in legs when laying flat   [] Claudication   [] Pain in feet when walking  [] Pain in feet at rest  [] Pain in feet when laying flat   [] History of DVT   [] Phlebitis   [] Swelling in legs   [] Varicose veins   [] Non-healing ulcers Pulmonary:   [] Uses home oxygen   [] Productive cough   [] Hemoptysis   [] Wheeze  [] COPD   [] Asthma Neurologic:  [] Dizziness  [] Blackouts   [] Seizures   [] History of stroke   [] History of TIA  [] Aphasia   [] Temporary blindness   [] Dysphagia   [] Weakness or numbness in arms   [] Weakness or numbness in legs Musculoskeletal:  [] Arthritis   [] Joint swelling   [] Joint pain   [] Low back pain Hematologic:  [] Easy bruising  [] Easy bleeding   [] Hypercoagulable state   [] Anemic   Gastrointestinal:  [] Blood in stool   [] Vomiting blood  [] Gastroesophageal reflux/heartburn   [] Abdominal pain Genitourinary:  [] Chronic kidney disease   [] Difficult urination  [] Frequent urination  [] Burning with urination   [] Hematuria Skin:  [] Rashes   [] Ulcers   [] Wounds Psychological:  [] History of anxiety   []  History of major depression.  Physical  Examination  BP 107/68 (BP Location: Right Arm)   Pulse (!) 102   Resp 18   Ht 5' (1.524 m)   Wt 119 lb (54 kg)   BMI 23.24 kg/m  Gen:  WD/WN, NAD.  Appears older than stated age Head: [place]/AT, No temporalis wasting. Ear/Nose/Throat: Hearing  grossly intact, nares w/o erythema or drainage Eyes: Conjunctiva clear. Sclera non-icteric Neck: Supple.  Trachea midline Pulmonary:  Good air movement, no use of accessory muscles.  Cardiac: RRR, no JVD Vascular:  Vessel Right Left  Radial Palpable Palpable                          PT Palpable Palpable  DP Palpable Palpable   Gastrointestinal: soft, non-tender/non-distended. No guarding/reflex.  Musculoskeletal: M/S 5/5 throughout.  No deformity or atrophy.  No significant lower extremity edema. Neurologic: Sensation grossly intact in extremities.  Symmetrical.  Speech is fluent.  Psychiatric: Judgment intact, Mood & affect appropriate for pt's clinical situation. Dermatologic: No rashes or ulcers noted.  No cellulitis or open wounds.       Labs No results found for this or any previous visit (from the past 2160 hour(s)).  Radiology No results found.  Assessment/Plan  Diabetes mellitus type 2, uncomplicated (HCC) blood glucose control important in reducing the progression of atherosclerotic disease. Also, involved in wound healing. On appropriate medications.   Essential hypertension blood pressure control important in reducing the progression of atherosclerotic disease. On appropriate oral medications.   Atherosclerosis of native arteries of extremity with intermittent claudication (HCC) She has brisk strong waveforms with an ABI of 1.28 on the right and 0.91 on the left.  Her digit pressure is 129 on the right and 85 on the left. Symptoms are markedly improved after surgical revascularization.  We can stretch out her follow-up in 6 months at this point.  Smoking cessation and medical risk factor management are important  for reduction of progression of disease.    Leotis Pain, MD  10/23/2019 2:28 PM    This note was created with Dragon medical transcription system.  Any errors from dictation are purely unintentional

## 2019-10-23 NOTE — Assessment & Plan Note (Signed)
She has brisk strong waveforms with an ABI of 1.28 on the right and 0.91 on the left.  Her digit pressure is 129 on the right and 85 on the left. Symptoms are markedly improved after surgical revascularization.  We can stretch out her follow-up in 6 months at this point.  Smoking cessation and medical risk factor management are important for reduction of progression of disease.

## 2020-01-10 ENCOUNTER — Other Ambulatory Visit: Payer: Self-pay | Admitting: Family Medicine

## 2020-01-10 DIAGNOSIS — Z1231 Encounter for screening mammogram for malignant neoplasm of breast: Secondary | ICD-10-CM

## 2020-01-22 ENCOUNTER — Ambulatory Visit
Admission: RE | Admit: 2020-01-22 | Discharge: 2020-01-22 | Disposition: A | Discharge status: Home / Self Care | Payer: BC Managed Care – PPO | Source: Ambulatory Visit | Attending: Family Medicine | Admitting: Family Medicine

## 2020-01-22 DIAGNOSIS — Z1231 Encounter for screening mammogram for malignant neoplasm of breast: Secondary | ICD-10-CM | POA: Insufficient documentation

## 2020-04-24 ENCOUNTER — Other Ambulatory Visit (INDEPENDENT_AMBULATORY_CARE_PROVIDER_SITE_OTHER): Payer: Self-pay | Admitting: Vascular Surgery

## 2020-05-13 ENCOUNTER — Encounter (INDEPENDENT_AMBULATORY_CARE_PROVIDER_SITE_OTHER): Payer: BC Managed Care – PPO

## 2020-05-13 ENCOUNTER — Ambulatory Visit (INDEPENDENT_AMBULATORY_CARE_PROVIDER_SITE_OTHER): Payer: BC Managed Care – PPO | Admitting: Vascular Surgery

## 2021-01-13 ENCOUNTER — Telehealth (INDEPENDENT_AMBULATORY_CARE_PROVIDER_SITE_OTHER): Payer: Self-pay | Admitting: Vascular Surgery

## 2021-01-13 NOTE — Telephone Encounter (Signed)
Called stating that lle is tender swollen, painful, rle hurts towards the ankle but not as bad as left. (Patient has been having issues for the past month) Patient states she went to see her cardiologist and he stated that this may be vascular and asked her to give [place] a call. Patient was last seen 09/2019 with abi studies (JD). Please advise.

## 2021-01-15 NOTE — Telephone Encounter (Signed)
Please bring the patient in for ABI, and see [facility] or [facility] please. Thank you

## 2021-01-15 NOTE — Telephone Encounter (Signed)
Patient is calling back to check the status of what she needs to do from previous telephone note. Please advise.

## 2021-01-29 ENCOUNTER — Encounter: Payer: Self-pay | Admitting: Ophthalmology

## 2021-02-10 NOTE — Discharge Instructions (Signed)

## 2021-02-20 ENCOUNTER — Encounter: Payer: Self-pay | Admitting: Ophthalmology

## 2021-02-23 NOTE — Anesthesia Preprocedure Evaluation (Addendum)
Anesthesia Evaluation  Patient identified by MRN, date of birth, ID band Patient awake    Reviewed: Allergy & Precautions, NPO status , Patient's Chart, lab work & pertinent test results  History of Anesthesia Complications (+) PONV and history of anesthetic complications  Airway Mallampati: III   Neck ROM: Full    Dental  (+) Edentulous Upper, Edentulous Lower   Pulmonary COPD (on home O2 4L), Current Smoker (1/2 ppd)Patient did not abstain from smoking.,    Pulmonary exam normal breath sounds clear to auscultation       Cardiovascular hypertension, + CAD (s/p MI and CABG), + CABG, + Peripheral Vascular Disease (s/p LE stents) and + DOE  Normal cardiovascular exam+ dysrhythmias (a fib on Plavix)  Rhythm:Regular Rate:Normal  Ischemic cardiomyopathy   Neuro/Psych PSYCHIATRIC DISORDERS Anxiety Depression Diabetic retinopathy    GI/Hepatic GERD  ,Liver cancer   Endo/Other  diabetes, Type 2Hypothyroidism   Renal/GU Renal disease (nephrolithiasis)     Musculoskeletal   Abdominal   Peds  Hematology  (+) Blood dyscrasia, anemia ,   Anesthesia Other Findings Per Notes in Care Everywhere from July:  She may proceed with her cataract surgery. They're not requesting she hold her aspirin and Plavix and would prefer she stay on these medications for her surgery.  Office Visit: 01/06/21: 1. Edema of both legs  2. S/P CABG x 3 (LIMA to LAD, SVG to ramus, SVG to PDA) on 11/02/18  3. Ischemic cardiomyopathy  4. HFrEF (heart failure with reduced ejection fraction) (CMS-HCC)  5. PAD (peripheral artery disease) (CMS-HCC)   11/02/2018 LIMA to LAD, SVG to ramus, SVG to PDA Persistently reduced severe ischemic cardiomyopathy, 30% 03/2020, despite guideline based therapy since revascularization She has been evaluated by electrophysiology, not a candidate for ICD due to advanced malignancy.  Relative hypotension and ongoing orthostasis is  limited her HF therapies that have been gradually titrated down over the past several months She is currently on no cardiomyopathy therapies, though blood pressure currently stable symptomatically improved Previously she had been on a beta-blocker and ARB, would not resume at this time, unless change in baseline blood pressure Continue aspirin, statin She has been maintained on clopidogrel from a vascular standpoint since her lower extremity stents placed in 2020    Reproductive/Obstetrics                           Anesthesia Physical Anesthesia Plan  ASA: 4  Anesthesia Plan: MAC   Post-op Pain Management:    Induction: Intravenous  PONV Risk Score and Plan: 2 and TIVA, Midazolam and Treatment may vary due to age or medical condition  Airway Management Planned: Nasal Cannula  Additional Equipment:   Intra-op Plan:   Post-operative Plan:   Informed Consent: I have reviewed the patients History and Physical, chart, labs and discussed the procedure including the risks, benefits and alternatives for the proposed anesthesia with the patient or authorized representative who has indicated his/her understanding and acceptance.       Plan Discussed with: CRNA  Anesthesia Plan Comments:        Anesthesia Quick Evaluation

## 2021-03-04 ENCOUNTER — Encounter
Admission: RE | Disposition: A | Discharge status: Home / Self Care | Payer: Self-pay | Source: Home / Self Care | Attending: Ophthalmology

## 2021-03-04 ENCOUNTER — Encounter: Payer: Self-pay | Admitting: Ophthalmology

## 2021-03-04 ENCOUNTER — Ambulatory Visit
Admission: RE | Admit: 2021-03-04 | Discharge: 2021-03-04 | Disposition: A | Discharge status: Home / Self Care | Payer: BC Managed Care – PPO | Attending: Ophthalmology | Admitting: Ophthalmology

## 2021-03-04 ENCOUNTER — Other Ambulatory Visit: Payer: Self-pay

## 2021-03-04 ENCOUNTER — Ambulatory Visit: Payer: BC Managed Care – PPO | Admitting: Anesthesiology

## 2021-03-04 DIAGNOSIS — E1139 Type 2 diabetes mellitus with other diabetic ophthalmic complication: Secondary | ICD-10-CM | POA: Diagnosis not present

## 2021-03-04 DIAGNOSIS — I110 Hypertensive heart disease with heart failure: Secondary | ICD-10-CM | POA: Diagnosis not present

## 2021-03-04 DIAGNOSIS — Z7984 Long term (current) use of oral hypoglycemic drugs: Secondary | ICD-10-CM | POA: Diagnosis not present

## 2021-03-04 DIAGNOSIS — E1136 Type 2 diabetes mellitus with diabetic cataract: Secondary | ICD-10-CM | POA: Diagnosis not present

## 2021-03-04 DIAGNOSIS — H2511 Age-related nuclear cataract, right eye: Secondary | ICD-10-CM | POA: Diagnosis not present

## 2021-03-04 DIAGNOSIS — F17210 Nicotine dependence, cigarettes, uncomplicated: Secondary | ICD-10-CM | POA: Diagnosis not present

## 2021-03-04 DIAGNOSIS — Z7982 Long term (current) use of aspirin: Secondary | ICD-10-CM | POA: Diagnosis not present

## 2021-03-04 DIAGNOSIS — Z9981 Dependence on supplemental oxygen: Secondary | ICD-10-CM | POA: Insufficient documentation

## 2021-03-04 DIAGNOSIS — Z882 Allergy status to sulfonamides status: Secondary | ICD-10-CM | POA: Diagnosis not present

## 2021-03-04 DIAGNOSIS — Z7951 Long term (current) use of inhaled steroids: Secondary | ICD-10-CM | POA: Diagnosis not present

## 2021-03-04 DIAGNOSIS — H42 Glaucoma in diseases classified elsewhere: Secondary | ICD-10-CM | POA: Diagnosis not present

## 2021-03-04 DIAGNOSIS — Z951 Presence of aortocoronary bypass graft: Secondary | ICD-10-CM | POA: Diagnosis not present

## 2021-03-04 DIAGNOSIS — Z79899 Other long term (current) drug therapy: Secondary | ICD-10-CM | POA: Diagnosis not present

## 2021-03-04 DIAGNOSIS — Z7902 Long term (current) use of antithrombotics/antiplatelets: Secondary | ICD-10-CM | POA: Diagnosis not present

## 2021-03-04 DIAGNOSIS — E113593 Type 2 diabetes mellitus with proliferative diabetic retinopathy without macular edema, bilateral: Secondary | ICD-10-CM | POA: Insufficient documentation

## 2021-03-04 DIAGNOSIS — Z888 Allergy status to other drugs, medicaments and biological substances status: Secondary | ICD-10-CM | POA: Diagnosis not present

## 2021-03-04 DIAGNOSIS — I509 Heart failure, unspecified: Secondary | ICD-10-CM | POA: Diagnosis not present

## 2021-03-04 HISTORY — DX: Unspecified systolic (congestive) heart failure: I50.20

## 2021-03-04 HISTORY — DX: Presence of other vascular implants and grafts: Z95.828

## 2021-03-04 HISTORY — PX: CATARACT EXTRACTION W/PHACO: SHX586

## 2021-03-04 HISTORY — DX: Dependence on supplemental oxygen: Z99.81

## 2021-03-04 HISTORY — DX: Presence of dental prosthetic device (complete) (partial): Z97.2

## 2021-03-04 LAB — GLUCOSE, CAPILLARY
Glucose-Capillary: 140 mg/dL — ABNORMAL HIGH (ref 70–99)
Glucose-Capillary: 148 mg/dL — ABNORMAL HIGH (ref 70–99)

## 2021-03-04 SURGERY — PHACOEMULSIFICATION, CATARACT, WITH IOL INSERTION
Anesthesia: Monitor Anesthesia Care | Site: Eye | Laterality: Right

## 2021-03-04 MED ORDER — SIGHTPATH DOSE#1 BSS IO SOLN
INTRAOCULAR | Status: DC | PRN
  Administered 2021-03-04: 1 mL

## 2021-03-04 MED ORDER — BRIMONIDINE TARTRATE-TIMOLOL 0.2-0.5 % OP SOLN
OPHTHALMIC | Status: DC | PRN
  Administered 2021-03-04: 1 [drp] via OPHTHALMIC

## 2021-03-04 MED ORDER — SIGHTPATH DOSE#1 BSS IO SOLN
INTRAOCULAR | Status: DC | PRN
  Administered 2021-03-04: 15 mL

## 2021-03-04 MED ORDER — FENTANYL CITRATE (PF) 100 MCG/2ML IJ SOLN
INTRAMUSCULAR | Status: DC | PRN
  Administered 2021-03-04: 50 ug via INTRAVENOUS

## 2021-03-04 MED ORDER — SIGHTPATH DOSE#1 NA HYALUR & NA CHOND-NA HYALUR IO KIT
PACK | INTRAOCULAR | Status: DC | PRN
  Administered 2021-03-04: 1 mL via OPHTHALMIC

## 2021-03-04 MED ORDER — MOXIFLOXACIN HCL 0.5 % OP SOLN
OPHTHALMIC | Status: DC | PRN
  Administered 2021-03-04: 0.2 mL via OPHTHALMIC

## 2021-03-04 MED ORDER — SIGHTPATH DOSE#1 BSS IO SOLN
INTRAOCULAR | Status: DC | PRN
  Administered 2021-03-04: 120 mL via OPHTHALMIC

## 2021-03-04 MED ORDER — PHENYLEPHRINE HCL 10 % OP SOLN
1.0000 [drp] | OPHTHALMIC | Status: DC | PRN
  Administered 2021-03-04 (×3): 1 [drp] via OPHTHALMIC

## 2021-03-04 MED ORDER — CYCLOPENTOLATE HCL 2 % OP SOLN
1.0000 [drp] | OPHTHALMIC | Status: DC | PRN
  Administered 2021-03-04 (×3): 1 [drp] via OPHTHALMIC

## 2021-03-04 MED ORDER — MIDAZOLAM HCL 2 MG/2ML IJ SOLN
INTRAMUSCULAR | Status: DC | PRN
  Administered 2021-03-04: 1 mg via INTRAVENOUS

## 2021-03-04 MED ORDER — NEOMYCIN-POLYMYXIN-DEXAMETH 3.5-10000-0.1 OP OINT
TOPICAL_OINTMENT | OPHTHALMIC | Status: DC | PRN
  Administered 2021-03-04: 1 via OPHTHALMIC

## 2021-03-04 MED ORDER — LACTATED RINGERS IV SOLN: INTRAVENOUS | Status: DC

## 2021-03-04 MED ORDER — TETRACAINE HCL 0.5 % OP SOLN
1.0000 [drp] | OPHTHALMIC | Status: DC | PRN
  Administered 2021-03-04 (×3): 1 [drp] via OPHTHALMIC

## 2021-03-04 SURGICAL SUPPLY — 23 items
CANNULA ANT/CHMB 27GA (MISCELLANEOUS) ×2 IMPLANT
GLOVE SURG ENC TEXT LTX SZ7.5 (GLOVE) ×2 IMPLANT
GLOVE SURG GAMMEX PI TX LF 7.5 (GLOVE) IMPLANT
GLOVE SURG TRIUMPH 8.0 PF LTX (GLOVE) ×2 IMPLANT
GOWN STRL REUS W/ TWL LRG LVL3 (GOWN DISPOSABLE) ×2 IMPLANT
GOWN STRL REUS W/TWL LRG LVL3 (GOWN DISPOSABLE) ×2
LENS IOL DIOP 20.5 (Intraocular Lens) ×2 IMPLANT
LENS IOL TECNIS MONO 20.5 (Intraocular Lens) ×1 IMPLANT
MARKER SKIN DUAL TIP RULER LAB (MISCELLANEOUS) ×2 IMPLANT
NDL RETROBULBAR .5 NSTRL (NEEDLE) IMPLANT
NEEDLE CAPSULORHEX 25GA (NEEDLE) ×2 IMPLANT
NEEDLE FILTER BLUNT 18X 1/2SAF (NEEDLE) ×2
NEEDLE FILTER BLUNT 18X1 1/2 (NEEDLE) ×2 IMPLANT
PACK EYE AFTER SURG (MISCELLANEOUS) ×2 IMPLANT
RING MALYGIN 7.0 (MISCELLANEOUS) IMPLANT
SUT ETHILON 10-0 CS-B-6CS-B-6 (SUTURE)
SUT VICRYL  9 0 (SUTURE)
SUT VICRYL 9 0 (SUTURE) IMPLANT
SUTURE EHLN 10-0 CS-B-6CS-B-6 (SUTURE) IMPLANT
SYR 3ML LL SCALE MARK (SYRINGE) ×4 IMPLANT
SYR TB 1ML LUER SLIP (SYRINGE) ×2 IMPLANT
WATER STERILE IRR 250ML POUR (IV SOLUTION) ×2 IMPLANT
WIPE NON LINTING 3.25X3.25 (MISCELLANEOUS) ×2 IMPLANT

## 2021-03-04 NOTE — Anesthesia Postprocedure Evaluation (Signed)
Anesthesia Post Note  Patient: Courtney Rich  Procedure(s) Performed: CATARACT EXTRACTION PHACO AND INTRAOCULAR LENS PLACEMENT (IOC) RIGHT MALYUGIN DIABETIC (Right: Eye)     Patient location during evaluation: PACU Anesthesia Type: MAC Level of consciousness: awake and alert, oriented and patient cooperative Pain management: pain level controlled Vital Signs Assessment: post-procedure vital signs reviewed and stable Respiratory status: spontaneous breathing, nonlabored ventilation and respiratory function stable Cardiovascular status: blood pressure returned to baseline and stable Postop Assessment: adequate PO intake Anesthetic complications: no   No notable events documented.  Darrin Nipper

## 2021-03-04 NOTE — Anesthesia Procedure Notes (Signed)
Date/Time: 03/04/2021 9:58 AM Performed by: Mayme Genta, CRNA Pre-anesthesia Checklist: Patient identified, Emergency Drugs available, Suction available, Timeout performed and Patient being monitored Patient Re-evaluated:Patient Re-evaluated prior to induction Oxygen Delivery Method: Nasal cannula Placement Confirmation: positive ETCO2

## 2021-03-04 NOTE — H&P (Signed)
[facility]   Primary Care Physician:  Zeb Comfort, MD Ophthalmologist: Dr. Leandrew Koyanagi  Pre-Procedure History & Physical: HPI:  Courtney Rich is a 60 y.o. female here for ophthalmic surgery.   Past Medical History:  Diagnosis Date   A-fib [facility])    Allergic genetic state    Cancer ([place]) 10/2018   liver    Chronic bronchitis with emphysema (HCC)    Depression    Depression    Diabetes mellitus without complication (HCC)    Glaucoma (increased eye pressure)    Heart failure with reduced ejection fraction (HCC)    History of kidney stones    Hyperlipidemia    Hypertension    Ischemic cardiomyopathy    Left bundle branch block (LBBB)    Left bundle branch block (LBBB)    PONV (postoperative nausea and vomiting)    Port-A-Cath in place    Proliferative diabetic retinopathy of both eyes (HCC)    Proliferative diabetic retinopathy of both eyes (HCC)    Supplemental oxygen dependent    4L   Wears dentures    full upper    Past Surgical History:  Procedure Laterality Date   CARDIAC SURGERY     CORONARY ARTERY BYPASS GRAFT     triple bypass    ENDARTERECTOMY FEMORAL Bilateral 05/30/2019   Procedure: ENDARTERECTOMY FEMORAL;  Surgeon: Algernon Huxley, MD;  Location: [facility] ORS;  Service: Vascular;  Laterality: Bilateral;   EYE SURGERY     INSERTION OF ILIAC STENT Bilateral 05/30/2019   Procedure: INSERTION OF ILIAC STENT;  Surgeon: Algernon Huxley, MD;  Location: [facility] ORS;  Service: Vascular;  Laterality: Bilateral;   KIDNEY STONE SURGERY     LOWER EXTREMITY ANGIOGRAPHY Right 05/14/2019   Procedure: LOWER EXTREMITY ANGIOGRAPHY;  Surgeon: Algernon Huxley, MD;  Location: [facility];  Service: Cardiovascular;  Laterality: Right;   TUBAL LIGATION     WRIST FRACTURE SURGERY Left     Prior to Admission medications   Medication Sig Start Date End Date Taking? Authorizing Provider  aspirin EC 81 MG EC tablet Take 1 tablet (81 mg total) by mouth daily at 6 (six)  AM. 06/03/19  Yes Serafina Mitchell, MD  brimonidine (ALPHAGAN) 0.2 % ophthalmic solution Place into the right eye 2 (two) times daily.   Yes [provider]  budesonide (PULMICORT) 0.25 MG/2ML nebulizer solution Take 0.25 mg by nebulization 2 (two) times daily.   Yes [provider]  cholecalciferol (VITAMIN D) 1000 units tablet Take 1,000 Units by mouth daily.   Yes [provider]  clopidogrel (PLAVIX) 75 MG tablet Take 1 tablet (75 mg total) by mouth daily at 6 (six) AM. 06/03/19  Yes Serafina Mitchell, MD  clopidogrel (PLAVIX) 75 MG tablet TAKE 1 TABLET BY MOUTH EVERY DAY 04/24/20  Yes Dew, Erskine Squibb, MD  glipiZIDE (GLUCOTROL) 10 MG tablet Take 10 mg by mouth daily before breakfast.   Yes [provider]  ipratropium (ATROVENT) 0.02 % nebulizer solution Take 0.5 mg by nebulization 4 (four) times daily.   Yes [provider]  ketoconazole (NIZORAL) 2 % cream Apply 1 application topically 2 (two) times daily.   Yes [provider]  levothyroxine (SYNTHROID) 150 MCG tablet Take 150 mcg by mouth daily before breakfast.   Yes [provider]  metFORMIN (GLUCOPHAGE) 500 MG tablet Take 1,000 mg by mouth 2 (two) times daily with a meal.    Yes [provider]  naloxone (NARCAN) nasal spray 4 mg/0.1 mL Place 1 spray into the nose.   Yes [provider]  ondansetron (ZOFRAN) 8 MG tablet Take by mouth every 8 (eight) hours as needed for nausea or vomiting.   Yes [provider]  oxyCODONE (OXY IR/ROXICODONE) 5 MG immediate release tablet Take 1-2 tablets (5-10 mg total) by mouth every 4 (four) hours as needed for moderate pain. 06/02/19  Yes Serafina Mitchell, MD  pembrolizumab [facility]) 100 MG/4ML SOLN Inject 2 mg/kg into the vein every 6 (six) weeks.   Yes [provider]  potassium chloride (KLOR-CON) 10 MEQ tablet Take 20 mEq by mouth daily.   Yes [provider]  pravastatin (PRAVACHOL) 40 MG tablet  Take 40 mg by mouth daily.   Yes [provider]  senna-docusate (SENOKOT-S) 8.6-50 MG tablet Take 1 tablet by mouth daily.   Yes [provider]  sertraline (ZOLOFT) 50 MG tablet Take 100 mg by mouth daily.   Yes [provider]  sitaGLIPtin (JANUVIA) 100 MG tablet Take 100 mg by mouth daily.   Yes [provider]  timolol (TIMOPTIC) 0.25 % ophthalmic solution Place 1 drop into the right eye 2 (two) times daily.   Yes [provider]  torsemide (DEMADEX) 20 MG tablet Take 20 mg by mouth daily. Pt reports she takes every other or every 3rd day.   Yes [provider]  albuterol (PROVENTIL HFA;VENTOLIN HFA) 108 (90 Base) MCG/ACT inhaler Inhale 2 puffs into the lungs every 4 (four) hours as needed for wheezing or shortness of breath.    [provider]  carvedilol (COREG) 3.125 MG tablet Take 3.125 mg by mouth 2 (two) times daily with a meal. Patient not taking: Reported on 01/29/2021    [provider]  cetirizine (ZYRTEC) 10 MG tablet Take 10 mg by mouth daily. Patient not taking: Reported on 01/29/2021    [provider]  isosorbide mononitrate (IMDUR) 30 MG 24 hr tablet Take 30 mg by mouth daily. Patient not taking: Reported on 01/29/2021    [provider]  losartan (COZAAR) 25 MG tablet Take 25 mg by mouth daily. Patient not taking: Reported on 01/29/2021    [provider]    Allergies as of 01/01/2021 - Review Complete 07/22/2019  Allergen Reaction Noted   Dapagliflozin Nausea Only 02/21/2019   Empagliflozin Nausea Only 02/21/2019   Lisinopril  12/09/2017   Other  04/24/2019   Sulfa antibiotics Swelling 06/14/2014    Family History  Problem Relation Age of Onset   Breast cancer Neg Hx     Social History   Socioeconomic History   Marital status: Married    Spouse name: Not on file   Number of children: Not on file   Years of education: Not on file   Highest education level: Not on file   Occupational History   Not on file  Tobacco Use   Smoking status: Every Day    Packs/day: 0.50    Years: 42.00    Pack years: 21.00    Types: Cigarettes   Smokeless tobacco: Never  Vaping Use   Vaping Use: Never used  Substance and Sexual Activity   Alcohol use: No   Drug use: Never   Sexual activity: Not Currently  Other Topics Concern   Not on file  Social History Narrative   Not on file   Social Determinants of Health   Financial Resource Strain: Not on file  Food Insecurity: Not on  file  Transportation Needs: Not on file  Physical Activity: Not on file  Stress: Not on file  Social Connections: Not on file  Intimate Partner Violence: Not on file    Review of Systems: See HPI, otherwise negative ROS  Physical Exam: Ht 5' (1.524 m)   Wt 47.6 kg   BMI 20.51 kg/m  General:   Alert,  pleasant and cooperative in NAD Head:  Normocephalic and atraumatic. Lungs:  Clear to auscultation.    Heart:  Regular rate and rhythm.   Impression/Plan: Courtney Rich is here for ophthalmic surgery.  Risks, benefits, limitations, and alternatives regarding ophthalmic surgery have been reviewed with the patient.  Questions have been answered.  All parties agreeable.   Leandrew Koyanagi, MD  03/04/2021, 8:43 AM

## 2021-03-04 NOTE — Transfer of Care (Signed)
Immediate Anesthesia Transfer of Care Note  Patient: Courtney Rich  Procedure(s) Performed: CATARACT EXTRACTION PHACO AND INTRAOCULAR LENS PLACEMENT (IOC) RIGHT MALYUGIN DIABETIC (Right: Eye)  Patient Location: PACU  Anesthesia Type: MAC  Level of Consciousness: awake, alert  and patient cooperative  Airway and Oxygen Therapy: Patient Spontanous Breathing and Patient connected to supplemental oxygen  Post-op Assessment: Post-op Vital signs reviewed, Patient's Cardiovascular Status Stable, Respiratory Function Stable, Patent Airway and No signs of Nausea or vomiting  Post-op Vital Signs: Reviewed and stable  Complications: No notable events documented.

## 2021-03-04 NOTE — Op Note (Signed)
LOCATION:  [place]   PREOPERATIVE DIAGNOSIS:    Nuclear sclerotic cataract right eye. H25.11   POSTOPERATIVE DIAGNOSIS:  Nuclear sclerotic cataract right eye.     PROCEDURE:  Phacoemusification with posterior chamber intraocular lens placement of the right eye   ULTRASOUND TIME: Procedure(s) with comments: CATARACT EXTRACTION PHACO AND INTRAOCULAR LENS PLACEMENT (IOC) RIGHT MALYUGIN DIABETIC (Right) - 10.75 1:53.4  LENS:   Implant Name Type Inv. Item Serial No. Manufacturer Lot No. LRB No. Used Action  LENS IOL DIOP 20.5 - [id number] Intraocular Lens LENS IOL DIOP 20.5 [id number]   Right 1 Implanted         SURGEON:  Wyonia Hough, MD   ANESTHESIA:  Topical with tetracaine drops and 2% Xylocaine jelly, augmented with 1% preservative-free intracameral lidocaine.    COMPLICATIONS:  None.   DESCRIPTION OF PROCEDURE:  The patient was identified in the holding room and transported to the operating room and placed in the supine position under the operating microscope.  The right eye was identified as the operative eye and it was prepped and draped in the usual sterile ophthalmic fashion.   A 1 millimeter clear-corneal paracentesis was made at the 12:00 position.  0.5 ml of preservative-free 1% lidocaine was injected into the anterior chamber. The anterior chamber was filled with Viscoat viscoelastic.  A 2.4 millimeter keratome was used to make a near-clear corneal incision at the 9:00 position.  A curvilinear capsulorrhexis was made with a cystotome and capsulorrhexis forceps.  Balanced salt solution was used to hydrodissect and hydrodelineate the nucleus.   Phacoemulsification was then used in stop and chop fashion to remove the lens nucleus and epinucleus.  The remaining cortex was then removed using the irrigation and aspiration handpiece. Provisc was then placed into the capsular bag to distend it for lens placement.  A lens was then injected into the  capsular bag.  The remaining viscoelastic was aspirated.   Wounds were hydrated with balanced salt solution.  The anterior chamber was inflated to a physiologic pressure with balanced salt solution.  No wound leaks were noted.   Timolol and Brimonidine drops were applied to the eye.  The patient was taken to the recovery room in stable condition without complications of anesthesia or surgery.   Ewin Rehberg 03/04/2021, 10:19 AM

## 2021-03-05 ENCOUNTER — Encounter: Payer: Self-pay | Admitting: Ophthalmology

## 2021-04-24 ENCOUNTER — Other Ambulatory Visit (INDEPENDENT_AMBULATORY_CARE_PROVIDER_SITE_OTHER): Payer: Self-pay | Admitting: Vascular Surgery

## 2021-10-24 DEATH — deceased

## 2021-12-23 IMAGING — MG DIGITAL SCREENING BILAT W/ TOMO W/ CAD
6 of 10 series · 6 of 30 positions shown · non-contrast
Comparison: Previous exam(s).

CLINICAL DATA: Screening.

EXAM:
DIGITAL SCREENING BILATERAL MAMMOGRAM WITH TOMO AND CAD

[L CC synth-2D]
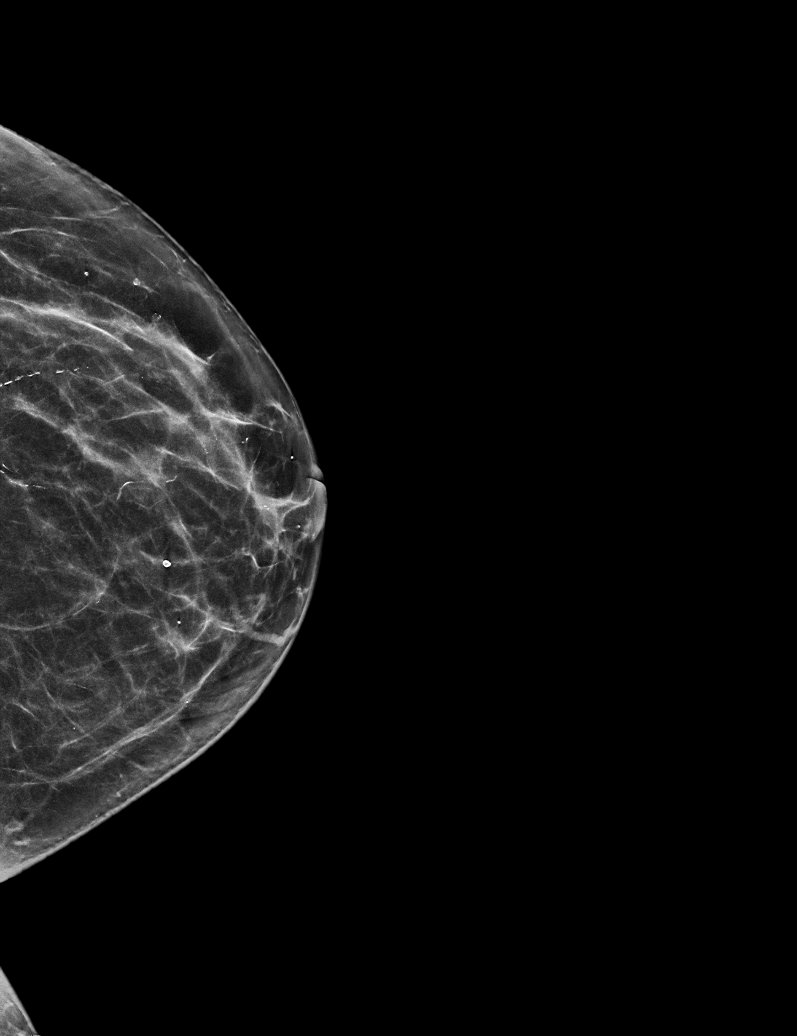

[L MLO synth-2D]
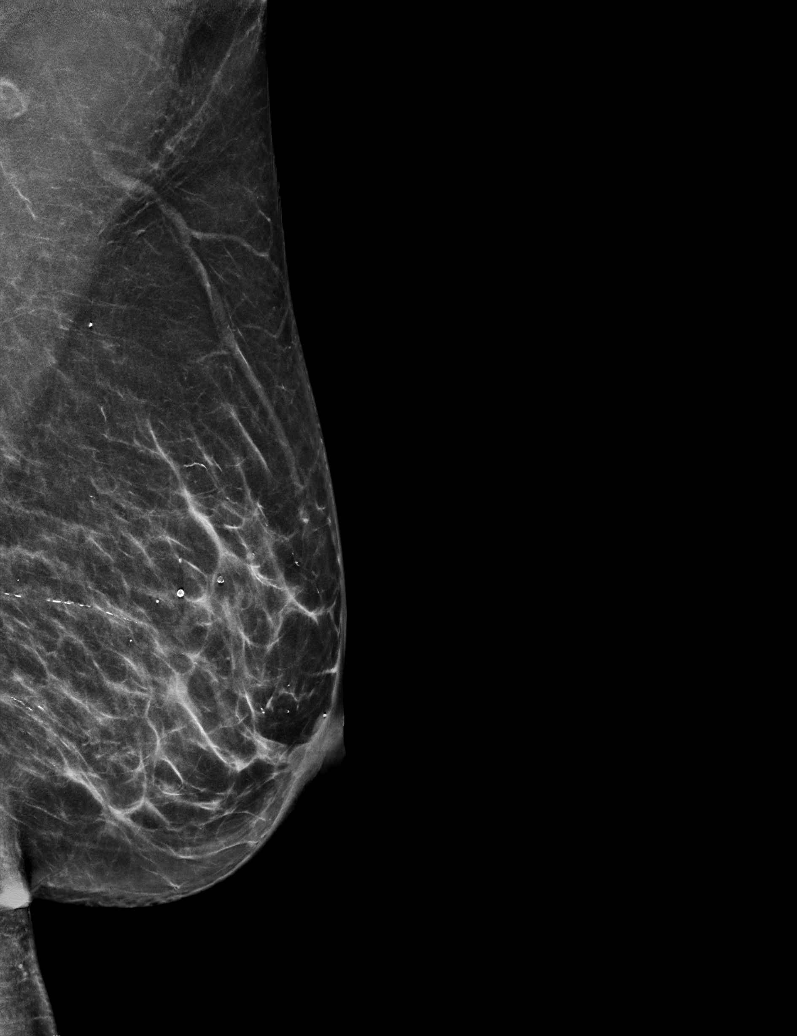

[R CC synth-2D]
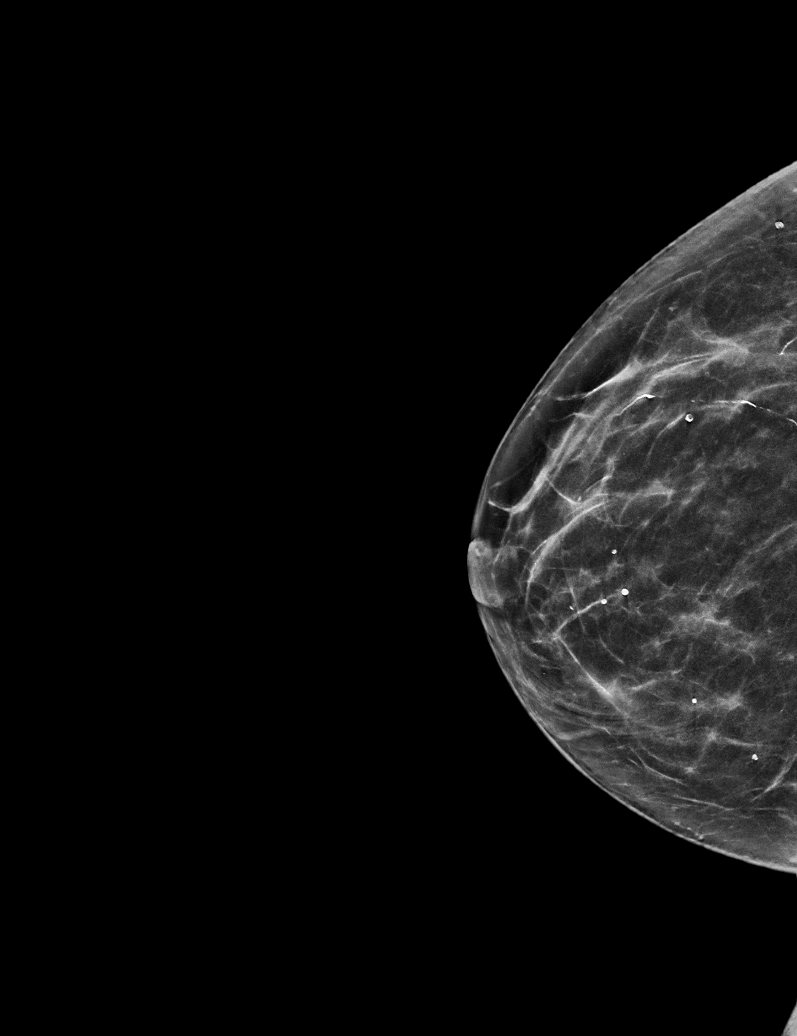

[R MLO synth-2D (1 of 2)]
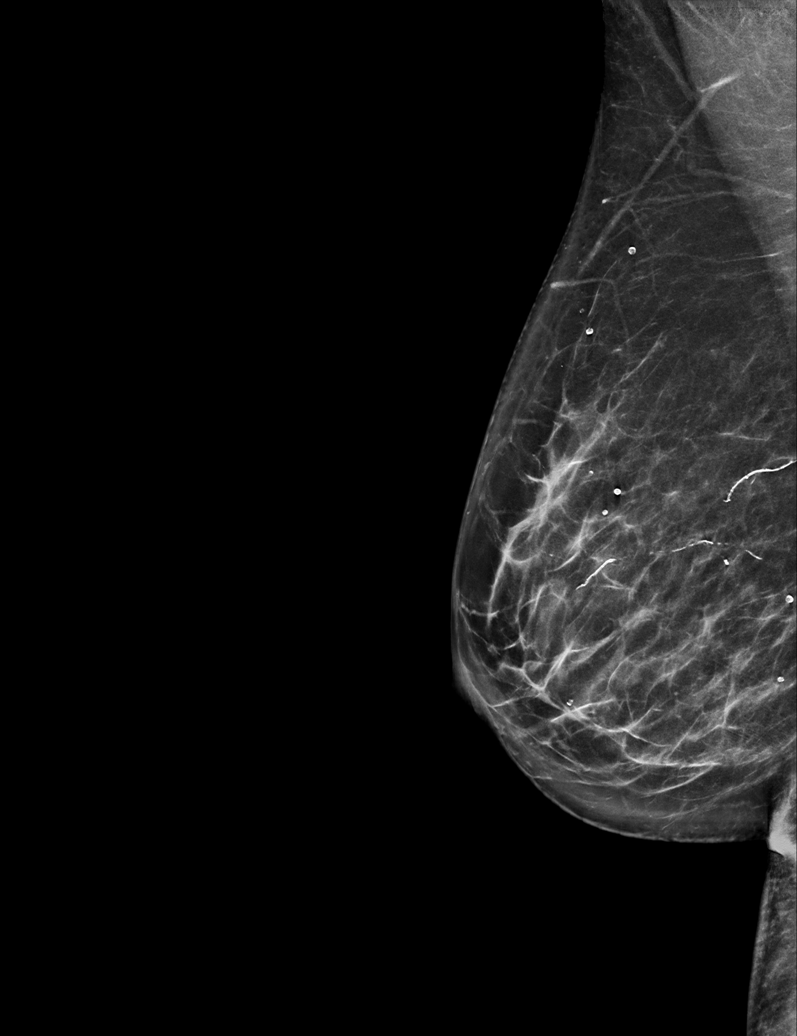

[R MLO synth-2D (2 of 2)]
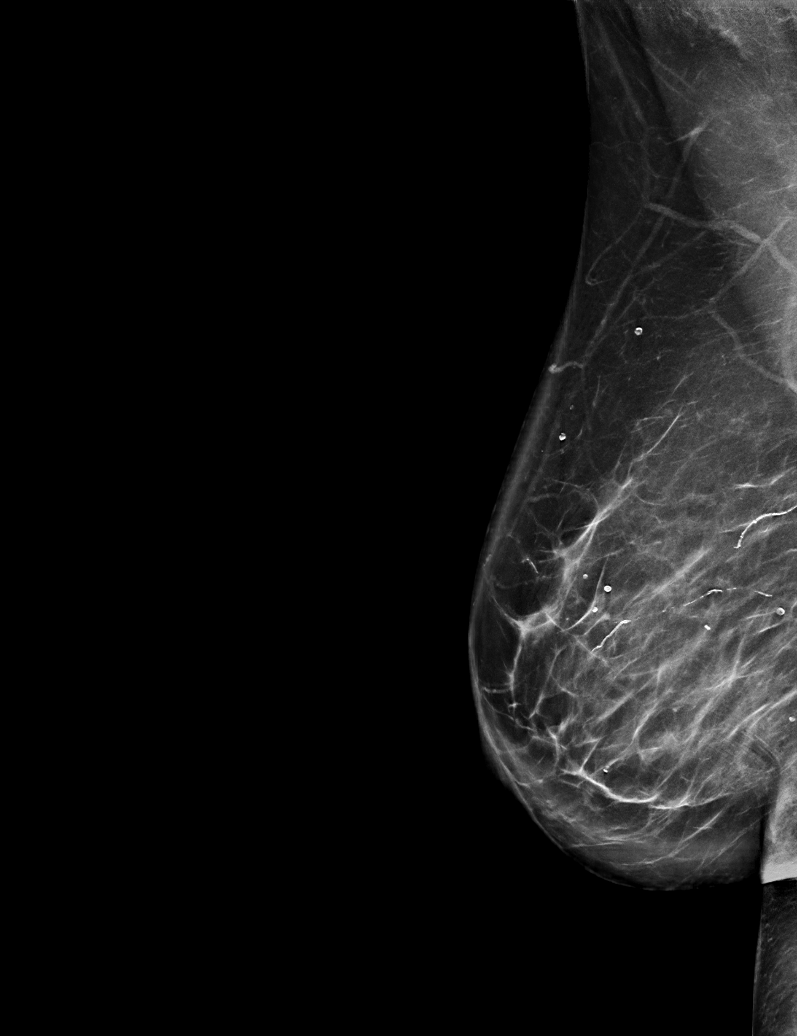

[R CC tomo · tomo slice 29/58.0]
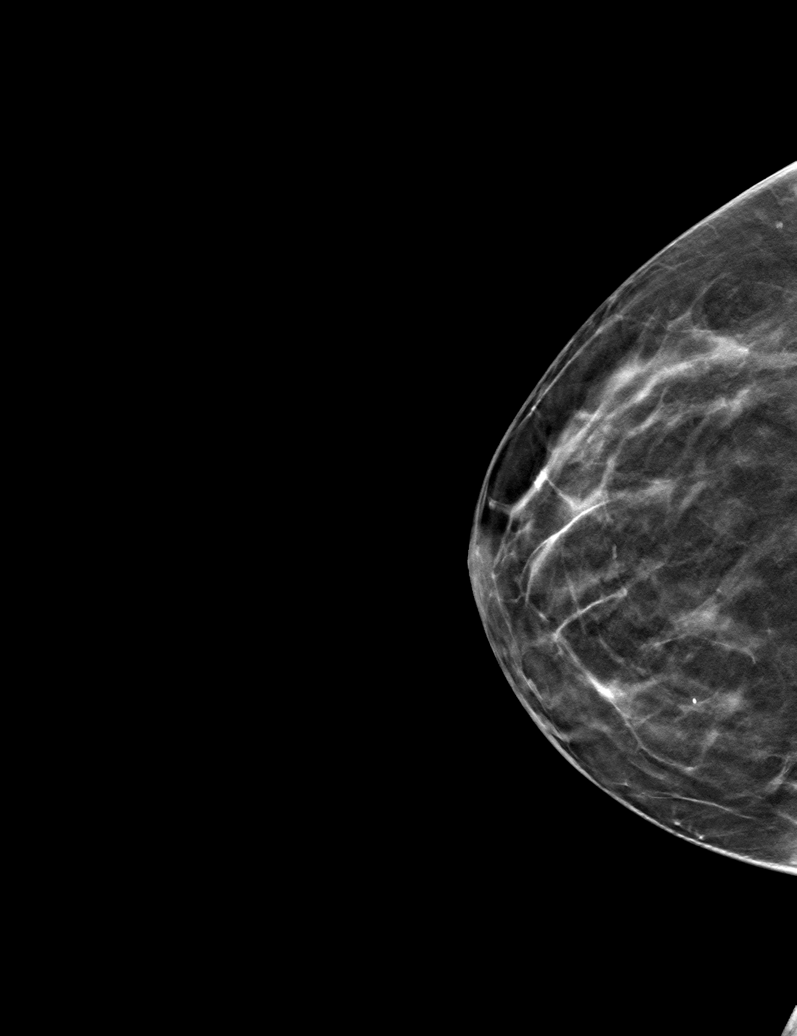

[6 of 30 positions shown; findings below may reference images not displayed]

ACR Breast Density Category b: There are scattered areas of
fibroglandular density.
FINDINGS: There are no findings suspicious for malignancy. Images were
processed with CAD.
IMPRESSION: No mammographic evidence of malignancy. A result letter of this
screening mammogram will be mailed directly to the patient.

RECOMMENDATION:
Screening mammogram in one year. (Code:[id number])

BI-RADS CATEGORY  1: Negative.
